# Patient Record
Sex: Male | Born: 1935 | Race: White | Hispanic: No | Marital: Married | Smoking: Never smoker
Health system: Southern US, Community
[De-identification: ages and names within clinical notes are randomized; demographics above are authoritative.]

## PROBLEM LIST (undated history)

## (undated) DIAGNOSIS — E785 Hyperlipidemia, unspecified: Secondary | ICD-10-CM

## (undated) DIAGNOSIS — I4891 Unspecified atrial fibrillation: Secondary | ICD-10-CM

## (undated) DIAGNOSIS — I639 Cerebral infarction, unspecified: Secondary | ICD-10-CM

## (undated) DIAGNOSIS — N400 Enlarged prostate without lower urinary tract symptoms: Secondary | ICD-10-CM

## (undated) DIAGNOSIS — M109 Gout, unspecified: Secondary | ICD-10-CM

## (undated) DIAGNOSIS — I10 Essential (primary) hypertension: Secondary | ICD-10-CM

## (undated) DIAGNOSIS — G4700 Insomnia, unspecified: Secondary | ICD-10-CM

## (undated) HISTORY — PX: SUPRAPUBIC CATHETER INSERTION: SUR719

---

## 2015-07-02 ENCOUNTER — Emergency Department (HOSPITAL_COMMUNITY)
Admission: EM | Admit: 2015-07-02 | Discharge: 2015-07-02 | Disposition: A | Discharge status: Home / Self Care | Payer: Medicare Other | Attending: Emergency Medicine | Admitting: Emergency Medicine

## 2015-07-02 ENCOUNTER — Emergency Department (HOSPITAL_COMMUNITY): Payer: Medicare Other

## 2015-07-02 ENCOUNTER — Encounter (HOSPITAL_COMMUNITY): Payer: Self-pay | Admitting: Emergency Medicine

## 2015-07-02 DIAGNOSIS — R140 Abdominal distension (gaseous): Secondary | ICD-10-CM | POA: Diagnosis not present

## 2015-07-02 DIAGNOSIS — R339 Retention of urine, unspecified: Secondary | ICD-10-CM | POA: Insufficient documentation

## 2015-07-02 DIAGNOSIS — I10 Essential (primary) hypertension: Secondary | ICD-10-CM | POA: Insufficient documentation

## 2015-07-02 DIAGNOSIS — M25551 Pain in right hip: Secondary | ICD-10-CM

## 2015-07-02 DIAGNOSIS — Z87438 Personal history of other diseases of male genital organs: Secondary | ICD-10-CM | POA: Insufficient documentation

## 2015-07-02 DIAGNOSIS — Z8673 Personal history of transient ischemic attack (TIA), and cerebral infarction without residual deficits: Secondary | ICD-10-CM | POA: Insufficient documentation

## 2015-07-02 DIAGNOSIS — R52 Pain, unspecified: Secondary | ICD-10-CM

## 2015-07-02 HISTORY — DX: Cerebral infarction, unspecified: I63.9

## 2015-07-02 HISTORY — DX: Benign prostatic hyperplasia without lower urinary tract symptoms: N40.0

## 2015-07-02 HISTORY — DX: Essential (primary) hypertension: I10

## 2015-07-02 LAB — BASIC METABOLIC PANEL
Anion gap: 6 (ref 5–15)
BUN: 38 mg/dL — AB (ref 6–20)
CO2: 26 mmol/L (ref 22–32)
Calcium: 9.2 mg/dL (ref 8.9–10.3)
Chloride: 110 mmol/L (ref 101–111)
Creatinine, Ser: 1.45 mg/dL — ABNORMAL HIGH (ref 0.61–1.24)
GFR calc Af Amer: 46 mL/min — ABNORMAL LOW (ref >60)
GFR calc non Af Amer: 39 mL/min — ABNORMAL LOW (ref >60)
Glucose, Bld: 103 mg/dL — ABNORMAL HIGH (ref 65–99)
POTASSIUM: 3.6 mmol/L (ref 3.5–5.1)
Sodium: 142 mmol/L (ref 135–145)

## 2015-07-02 LAB — URINALYSIS, ROUTINE W REFLEX MICROSCOPIC
Bilirubin Urine: NEGATIVE
Glucose, UA: NEGATIVE mg/dL
KETONES UR: 15 mg/dL — AB
Nitrite: NEGATIVE
Specific Gravity, Urine: 1.017 (ref 1.005–1.030)
Urobilinogen, UA: 1 mg/dL (ref 0.0–1.0)
pH: 7.5 (ref 5.0–8.0)

## 2015-07-02 LAB — CBC WITH DIFFERENTIAL/PLATELET
BASOS PCT: 1 % (ref 0–1)
Basophils Absolute: 0 10*3/uL (ref 0.0–0.1)
Eosinophils Absolute: 0.2 10*3/uL (ref 0.0–0.7)
Eosinophils Relative: 3 % (ref 0–5)
HCT: 38.8 % — ABNORMAL LOW (ref 39.0–52.0)
Hemoglobin: 12.1 g/dL — ABNORMAL LOW (ref 13.0–17.0)
Lymphocytes Relative: 10 % — ABNORMAL LOW (ref 12–46)
Lymphs Abs: 0.5 10*3/uL — ABNORMAL LOW (ref 0.7–4.0)
MCH: 24.9 pg — ABNORMAL LOW (ref 26.0–34.0)
MCHC: 31.2 g/dL (ref 30.0–36.0)
MCV: 79.8 fL (ref 78.0–100.0)
Monocytes Absolute: 0.5 10*3/uL (ref 0.1–1.0)
Monocytes Relative: 11 % (ref 3–12)
NEUTROS ABS: 3.9 10*3/uL (ref 1.7–7.7)
Neutrophils Relative %: 75 % (ref 43–77)
Platelets: 161 10*3/uL (ref 150–400)
RBC: 4.86 MIL/uL (ref 4.22–5.81)
RDW: 16.9 % — ABNORMAL HIGH (ref 11.5–15.5)
WBC: 5.1 10*3/uL (ref 4.0–10.5)

## 2015-07-02 LAB — URINE MICROSCOPIC-ADD ON

## 2015-07-02 MED ORDER — HYDROCODONE-ACETAMINOPHEN 5-325 MG PO TABS
1.0000 | ORAL_TABLET | Freq: Once | ORAL | Status: AC
  Administered 2015-07-02: 1 via ORAL
  Filled 2015-07-02: qty 1

## 2015-07-02 MED ORDER — HYDROCODONE-ACETAMINOPHEN 5-325 MG PO TABS: 1.0000 | ORAL_TABLET | Freq: Four times a day (QID) | ORAL | Status: DC | PRN

## 2015-07-02 NOTE — ED Notes (Signed)
MD notfied patient would like to see him

## 2015-07-02 NOTE — ED Provider Notes (Signed)
CSN: [id number]     Arrival date & time 07/02/15  1323 History   First MD Initiated Contact with Patient 07/02/15 1512     Chief Complaint  Patient presents with  . catheter change    . Urinary Retention  . right hip pain      (Consider location/radiation/quality/duration/timing/severity/associated sxs/prior Treatment) The history is provided by the patient and a relative.   patient brought in by family recently moved from out of town. Patient has follow-up with new primary care doctor in a few days. Patient's had an indwelling suprapubic catheter due to prostate problems for many months. Normally changed every 8 weeks. Has not been changed recently. Patient has not had any urine come out of it of 4 approximately 48 hours. Distended abdomen.          Past Medical History  Diagnosis Date  . Hypertension   . Prostate enlargement   . Stroke     no residual weakness   Past Surgical History  Procedure Laterality Date  . Suprapubic catheter insertion     No family history on file. Social History  Substance Use Topics  . Smoking status: Never Smoker   . Smokeless tobacco: None  . Alcohol Use: No    Review of Systems  Constitutional: Negative for fever.  HENT: Negative for congestion.   Respiratory: Negative for shortness of breath.   Cardiovascular: Negative for chest pain.  Gastrointestinal: Negative for abdominal pain.  Genitourinary: Negative for dysuria.  Musculoskeletal: Negative for back pain.  Neurological: Negative for headaches.  Hematological: Does not bruise/bleed easily.      Allergies  Review of patient's allergies indicates not on file.  Home Medications   Prior to Admission medications   Medication Sig Start Date End Date Taking? Authorizing Provider  HYDROcodone-acetaminophen (NORCO/VICODIN) 5-325 MG per tablet Take 1-2 tablets by mouth every 6 (six) hours as needed for moderate pain. 07/02/15   Fredia Sorrow, MD   BP 148/76 mmHg  Pulse 57   Temp(Src) 98.2 F (36.8 C) (Oral)  Resp 18  Ht 6\' 1"  (1.854 m)  Wt 170 lb (77.111 kg)  BMI 22.43 kg/m2  SpO2 95% Physical Exam  Constitutional: He is oriented to person, place, and time. He appears well-developed and well-nourished. No distress.  HENT:  Head: Normocephalic and atraumatic.  Mouth/Throat: Oropharynx is clear and moist.  Eyes: Conjunctivae and EOM are normal. Pupils are equal, round, and reactive to light.  Neck: Normal range of motion.  Cardiovascular: Normal rate, regular rhythm and normal heart sounds.   No murmur heard. Pulmonary/Chest: Effort normal and breath sounds normal.  Abdominal: Soft. Bowel sounds are normal. He exhibits distension.  Abdomen distended. Superior pubic catheter in place but not draining.  Musculoskeletal: Normal range of motion.  Neurological: He is alert and oriented to person, place, and time. No cranial nerve deficit. He exhibits normal muscle tone. Coordination normal.  Skin: Skin is warm. No rash noted.  Nursing note and vitals reviewed.   ED Course  Procedures (including critical care time) Labs Review Labs Reviewed  BASIC METABOLIC PANEL - Abnormal; Notable for the following:    Glucose, Bld 103 (*)    BUN 38 (*)    Creatinine, Ser 1.45 (*)    GFR calc non Af Amer 39 (*)    GFR calc Af Amer 46 (*)    All other components within normal limits  CBC WITH DIFFERENTIAL/PLATELET - Abnormal; Notable for the following:    Hemoglobin 12.1 (*)  HCT 38.8 (*)    MCH 24.9 (*)    RDW 16.9 (*)    Lymphocytes Relative 10 (*)    Lymphs Abs 0.5 (*)    All other components within normal limits  URINALYSIS, ROUTINE W REFLEX MICROSCOPIC (NOT AT [facility]) - Abnormal; Notable for the following:    Color, Urine RED (*)    APPearance TURBID (*)    Hgb urine dipstick LARGE (*)    Ketones, ur 15 (*)    Protein, ur >300 (*)    Leukocytes, UA LARGE (*)    All other components within normal limits  URINE MICROSCOPIC-ADD ON - Abnormal; Notable for  the following:    Bacteria, UA MANY (*)    Crystals TRIPLE PHOSPHATE CRYSTALS (*)    All other components within normal limits  URINE CULTURE    Imaging Review Dg Hip Unilat With Pelvis 2-3 Views Right  07/02/2015   CLINICAL DATA:  Right hip pain for several months without known injury. Initial encounter.  EXAM: DG HIP (WITH OR WITHOUT PELVIS) 2-3V RIGHT  COMPARISON:  None.  FINDINGS: There is no evidence of hip fracture or dislocation. There is no evidence of arthropathy or other focal bone abnormality.  IMPRESSION: Normal right hip.   Electronically Signed   By: Marijo Conception, M.D.   On: 07/02/2015 15:06   I, Joeanna Howdyshell, personally reviewed and evaluated these images and lab results as part of my medical decision-making.   EKG Interpretation None      MDM   Final diagnoses:  Urinary retention  Hip pain, right    Patient with the suprapubic catheter due to prostate obstruction been present at least for 8 months. Usually gets changed every 8 weeks. No urine is come out of the catheter for probably about 18 hours. Suprapubic catheter was replaced with a 16 French Foley good flow out. Some sediment no significant bleeding urine sent for culture but these urines normally are consistent with urinary tract infection. Patient has no systemic symptoms no fever no confusion so will not treat empirically with anti-biotics  Patient also with complaint of right hip pain worries been having trouble with bursitis patient had a cortisone injection in there in the past. No new falls or injuries. Examination of that area without any obvious abnormalities no redness. Will treat just with pain medicine for now.  Fredia Sorrow, MD 07/07/15 1747

## 2015-07-02 NOTE — ED Notes (Signed)
Pt in radiology. Radiology transport to bring pt to room.  

## 2015-07-02 NOTE — ED Notes (Signed)
MD at bedside to change cath.

## 2015-07-02 NOTE — ED Notes (Signed)
Patient currently not in room

## 2015-07-02 NOTE — Discharge Instructions (Signed)
Take pain medicine as needed for your hip. Follow-up with your doctor that you got arranged on Monday. Empty your bladder every 4-6 hours through the suprapubic catheter. Return for any new or worse symptoms.

## 2015-07-02 NOTE — ED Notes (Addendum)
Pt new to town, moved here this week from [place]-- has a supra-pubic catheter for prostate problems (no cancer). States catheter is not draining as normal, but feels like it is clogged. Son is with pt, states yesterday pt was a little confused. Not confused today. Gets catheter changed every 8 weeks --- has appt Monday with [facility] at [facility] in the independent living area.  Had a cortisone shot in right hip prior to coming here -- having severe pain in right hip at present

## 2015-07-03 LAB — URINE CULTURE

## 2015-07-25 ENCOUNTER — Emergency Department (HOSPITAL_COMMUNITY): Payer: PPO

## 2015-07-25 ENCOUNTER — Emergency Department (HOSPITAL_COMMUNITY)
Admission: EM | Admit: 2015-07-25 | Discharge: 2015-07-25 | Disposition: A | Discharge status: Home / Self Care | Payer: PPO | Attending: Emergency Medicine | Admitting: Emergency Medicine

## 2015-07-25 ENCOUNTER — Encounter (HOSPITAL_COMMUNITY): Payer: Self-pay | Admitting: *Deleted

## 2015-07-25 DIAGNOSIS — M6281 Muscle weakness (generalized): Secondary | ICD-10-CM | POA: Insufficient documentation

## 2015-07-25 DIAGNOSIS — M549 Dorsalgia, unspecified: Secondary | ICD-10-CM | POA: Diagnosis present

## 2015-07-25 DIAGNOSIS — N390 Urinary tract infection, site not specified: Secondary | ICD-10-CM | POA: Diagnosis not present

## 2015-07-25 DIAGNOSIS — Z8673 Personal history of transient ischemic attack (TIA), and cerebral infarction without residual deficits: Secondary | ICD-10-CM | POA: Insufficient documentation

## 2015-07-25 DIAGNOSIS — R531 Weakness: Secondary | ICD-10-CM

## 2015-07-25 DIAGNOSIS — I10 Essential (primary) hypertension: Secondary | ICD-10-CM | POA: Insufficient documentation

## 2015-07-25 DIAGNOSIS — Z87438 Personal history of other diseases of male genital organs: Secondary | ICD-10-CM | POA: Insufficient documentation

## 2015-07-25 LAB — URINALYSIS, ROUTINE W REFLEX MICROSCOPIC
Bilirubin Urine: NEGATIVE
GLUCOSE, UA: NEGATIVE mg/dL
Ketones, ur: NEGATIVE mg/dL
Nitrite: NEGATIVE
PH: 5.5 (ref 5.0–8.0)
Protein, ur: 30 mg/dL — AB
Specific Gravity, Urine: 1.016 (ref 1.005–1.030)
Urobilinogen, UA: 0.2 mg/dL (ref 0.0–1.0)

## 2015-07-25 LAB — COMPREHENSIVE METABOLIC PANEL
ALBUMIN: 3.3 g/dL — AB (ref 3.5–5.0)
ALT: 30 U/L (ref 17–63)
AST: 35 U/L (ref 15–41)
Alkaline Phosphatase: 60 U/L (ref 38–126)
Anion gap: 9 (ref 5–15)
BUN: 32 mg/dL — ABNORMAL HIGH (ref 6–20)
CO2: 26 mmol/L (ref 22–32)
Calcium: 9 mg/dL (ref 8.9–10.3)
Chloride: 104 mmol/L (ref 101–111)
Creatinine, Ser: 1.58 mg/dL — ABNORMAL HIGH (ref 0.61–1.24)
GFR calc Af Amer: 41 mL/min — ABNORMAL LOW (ref >60)
GFR, EST NON AFRICAN AMERICAN: 35 mL/min — AB (ref >60)
Glucose, Bld: 105 mg/dL — ABNORMAL HIGH (ref 65–99)
POTASSIUM: 3.6 mmol/L (ref 3.5–5.1)
SODIUM: 139 mmol/L (ref 135–145)
Total Bilirubin: 0.7 mg/dL (ref 0.3–1.2)
Total Protein: 6.4 g/dL — ABNORMAL LOW (ref 6.5–8.1)

## 2015-07-25 LAB — CBC WITH DIFFERENTIAL/PLATELET
Basophils Absolute: 0 10*3/uL (ref 0.0–0.1)
Basophils Relative: 0 % (ref 0–1)
Eosinophils Absolute: 0.1 10*3/uL (ref 0.0–0.7)
Eosinophils Relative: 2 % (ref 0–5)
HCT: 39.4 % (ref 39.0–52.0)
HEMOGLOBIN: 12.1 g/dL — AB (ref 13.0–17.0)
LYMPHS ABS: 0.8 10*3/uL (ref 0.7–4.0)
Lymphocytes Relative: 12 % (ref 12–46)
MCH: 24.4 pg — AB (ref 26.0–34.0)
MCHC: 30.7 g/dL (ref 30.0–36.0)
MCV: 79.6 fL (ref 78.0–100.0)
Monocytes Absolute: 0.4 10*3/uL (ref 0.1–1.0)
Monocytes Relative: 7 % (ref 3–12)
Neutro Abs: 5 10*3/uL (ref 1.7–7.7)
Neutrophils Relative %: 79 % — ABNORMAL HIGH (ref 43–77)
Platelets: 256 10*3/uL (ref 150–400)
RBC: 4.95 MIL/uL (ref 4.22–5.81)
RDW: 16.8 % — ABNORMAL HIGH (ref 11.5–15.5)
WBC: 6.4 10*3/uL (ref 4.0–10.5)

## 2015-07-25 LAB — I-STAT CG4 LACTIC ACID, ED
Lactic Acid, Venous: 1.87 mmol/L (ref 0.5–2.0)
Lactic Acid, Venous: 0.93 mmol/L (ref 0.5–2.0)

## 2015-07-25 LAB — URINE MICROSCOPIC-ADD ON

## 2015-07-25 LAB — TROPONIN I

## 2015-07-25 MED ORDER — CEPHALEXIN 500 MG PO CAPS: 500.0000 mg | ORAL_CAPSULE | Freq: Two times a day (BID) | ORAL | Status: DC

## 2015-07-25 NOTE — Discharge Instructions (Signed)

## 2015-07-25 NOTE — ED Provider Notes (Signed)
CSN: [id number]     Arrival date & time 07/25/15  1609 History   First MD Initiated Contact with Patient 07/25/15 1851     Chief Complaint  Patient presents with  . Urinary Tract Infection  . Back Pain     (Consider location/radiation/quality/duration/timing/severity/associated sxs/prior Treatment) Patient is a 81 y.o. male presenting with general illness.  Illness Quality:  Confusion, generalized weakness,low back pain, urinary urgency  Severity:  Moderate Onset quality:  Gradual Duration:  1 week Timing:  Constant Progression:  Worsening Chronicity:  New Relieved by:  Nothing Ineffective treatments:  Placed on an antibiotic 3 days ago.  pt not sure which abx.   Associated symptoms: no chest pain, no cough, no fever, no nausea and no shortness of breath     Past Medical History  Diagnosis Date  . Hypertension   . Prostate enlargement   . Stroke     no residual weakness   Past Surgical History  Procedure Laterality Date  . Suprapubic catheter insertion     No family history on file. Social History  Substance Use Topics  . Smoking status: Never Smoker   . Smokeless tobacco: None  . Alcohol Use: No    Review of Systems  Constitutional: Negative for fever.  Respiratory: Negative for cough and shortness of breath.   Cardiovascular: Negative for chest pain.  Gastrointestinal: Negative for nausea.  All other systems reviewed and are negative.     Allergies  Review of patient's allergies indicates no known allergies.  Home Medications   Prior to Admission medications   Medication Sig Start Date End Date Taking? Authorizing Provider  HYDROcodone-acetaminophen (NORCO/VICODIN) 5-325 MG per tablet Take 1-2 tablets by mouth every 6 (six) hours as needed for moderate pain. 07/02/15   Fredia Sorrow, MD   BP 178/77 mmHg  Pulse 44  Temp(Src) 97.4 F (36.3 C) (Oral)  Resp 16  Ht 6\' 1"  (1.854 m)  Wt 170 lb (77.111 kg)  BMI 22.43 kg/m2  SpO2 100% Physical Exam   Constitutional: He is oriented to person, place, and time. He appears well-developed and well-nourished. No distress.  Generalized weakness  HENT:  Head: Normocephalic and atraumatic.  Mouth/Throat: Oropharynx is clear and moist.  Eyes: Conjunctivae are normal. Pupils are equal, round, and reactive to light. No scleral icterus.  Neck: Neck supple.  Cardiovascular: Normal rate, regular rhythm, normal heart sounds and intact distal pulses.   No murmur heard. Pulmonary/Chest: Effort normal and breath sounds normal. No stridor. No respiratory distress. He has no wheezes. He has no rales.  Abdominal: Soft. He exhibits no distension.  Soft. Mild tenderness in suprapubic region.  SPC in place without erythema or drainage.   Musculoskeletal: Normal range of motion. He exhibits no edema.  Neurological: He is alert and oriented to person, place, and time.  Skin: Skin is warm and dry. No rash noted.  Psychiatric: He has a normal mood and affect. His behavior is normal.  Nursing note and vitals reviewed.   ED Course  Procedures (including critical care time) Labs Review Labs Reviewed  COMPREHENSIVE METABOLIC PANEL - Abnormal; Notable for the following:    Glucose, Bld 105 (*)    BUN 32 (*)    Creatinine, Ser 1.58 (*)    Total Protein 6.4 (*)    Albumin 3.3 (*)    GFR calc non Af Amer 35 (*)    GFR calc Af Amer 41 (*)    All other components within normal limits  CBC  WITH DIFFERENTIAL/PLATELET - Abnormal; Notable for the following:    Hemoglobin 12.1 (*)    MCH 24.4 (*)    RDW 16.8 (*)    Neutrophils Relative % 79 (*)    All other components within normal limits  URINALYSIS, ROUTINE W REFLEX MICROSCOPIC (NOT AT [facility]) - Abnormal; Notable for the following:    APPearance CLOUDY (*)    Hgb urine dipstick MODERATE (*)    Protein, ur 30 (*)    Leukocytes, UA MODERATE (*)    All other components within normal limits  URINE MICROSCOPIC-ADD ON - Abnormal; Notable for the following:     Bacteria, UA FEW (*)    All other components within normal limits  URINE CULTURE  TROPONIN I  I-STAT CG4 LACTIC ACID, ED  I-STAT CG4 LACTIC ACID, ED    Imaging Review Dg Chest 2 View  07/25/2015   CLINICAL DATA:  Urinary frequency. Suprapubic catheter. Low back pain. Increased weakness and difficulty ambulating.  EXAM: CHEST  2 VIEW  COMPARISON:  None.  FINDINGS: Mild-to-moderate enlargement of the cardiopericardial silhouette noted with aortic tortuosity and atherosclerotic calcification of the aortic arch.  Thoracic spondylosis. The lungs appear clear aside from some mild scarring or atelectasis along the major fissure.  IMPRESSION: 1. No acute thoracic findings. 2. Mild to moderate enlargement of the cardiopericardial silhouette, without edema. Atherosclerotic aortic arch. 3. Thoracic spondylosis.   Electronically Signed   By: Van Clines M.D.   On: 07/25/2015 20:41   I have personally reviewed and evaluated these images and lab results as part of my medical decision-making.   EKG Interpretation   Date/Time:  Sunday July 25 2015 20:01:29 EDT Ventricular Rate:  46 PR Interval:  226 QRS Duration: 100 QT Interval:  529 QTC Calculation: 463 R Axis:   73 Text Interpretation:  Sinus bradycardia Borderline prolonged PR interval  LVH with secondary repolarization abnormality No old tracing to compare  Confirmed by [facility]  MD, TREY ([id number]) on 07/25/2015 11:05:19 PM      MDM   Final diagnoses:  Generalized weakness  UTI (lower urinary tract infection)    81 yo male with a week of generalize weakness.  It is not improved despite a UTI being treated with Cipro for the past few days. I spoke with his primary care doctor, Dr. Darron Doom, who was concerned about a potential metabolic problem, such as hyponatremia. Fortunately, metabolically, he appears stable.   On exam, he initially appeared weak, but nontoxic. But on recheck, he no longer appeared weak, he had eaten an entire meal,  and he ambulated with baseline strength and coordination. He and his family do not want to wait in the ED for head imaging. Given his appearance at time of discharge, it is reasonable to defer this test. He does not have focal symptoms suggestive of CVA.  His UA does still show signs of infection. He had a suprapubic catheter exchange 2 days ago. Will change Cipro to Keflex. He can follow-up with his primary doctor in a few days. He also has ample support at his living facility.   He was given return precautions.    Serita Grit, MD 07/25/15 [phone]

## 2015-07-25 NOTE — ED Notes (Addendum)
Pt was sent here by Dr Darron Doom ([facility]) for urinary frequency.   His son sates he has been on oral antibiotics for a UTI, however, he continues to wake up every 15 minutes with he urge to urinate (he does have a suprapubic catheter).  He's also c/o lower back pain and bil thigh pain.  Pt also c/o increased weakness and difficulty ambulating.

## 2015-07-26 LAB — URINE CULTURE: Culture: NO GROWTH

## 2015-07-28 ENCOUNTER — Encounter (HOSPITAL_COMMUNITY): Payer: Self-pay | Admitting: *Deleted

## 2015-07-28 ENCOUNTER — Inpatient Hospital Stay (HOSPITAL_COMMUNITY)
Admission: EM | Admit: 2015-07-28 | Discharge: 2015-07-31 | DRG: 065 | Disposition: A | Discharge status: Skilled Nursing Facility | Payer: PPO | Attending: Internal Medicine | Admitting: Internal Medicine

## 2015-07-28 ENCOUNTER — Emergency Department (HOSPITAL_COMMUNITY): Payer: PPO

## 2015-07-28 DIAGNOSIS — Z79899 Other long term (current) drug therapy: Secondary | ICD-10-CM | POA: Diagnosis not present

## 2015-07-28 DIAGNOSIS — N3000 Acute cystitis without hematuria: Secondary | ICD-10-CM | POA: Diagnosis not present

## 2015-07-28 DIAGNOSIS — I480 Paroxysmal atrial fibrillation: Secondary | ICD-10-CM | POA: Diagnosis present

## 2015-07-28 DIAGNOSIS — I4891 Unspecified atrial fibrillation: Secondary | ICD-10-CM | POA: Diagnosis present

## 2015-07-28 DIAGNOSIS — R4701 Aphasia: Secondary | ICD-10-CM | POA: Diagnosis not present

## 2015-07-28 DIAGNOSIS — Z7982 Long term (current) use of aspirin: Secondary | ICD-10-CM | POA: Diagnosis not present

## 2015-07-28 DIAGNOSIS — I6789 Other cerebrovascular disease: Secondary | ICD-10-CM | POA: Diagnosis not present

## 2015-07-28 DIAGNOSIS — Z936 Other artificial openings of urinary tract status: Secondary | ICD-10-CM

## 2015-07-28 DIAGNOSIS — I639 Cerebral infarction, unspecified: Secondary | ICD-10-CM | POA: Diagnosis present

## 2015-07-28 DIAGNOSIS — I6340 Cerebral infarction due to embolism of unspecified cerebral artery: Secondary | ICD-10-CM | POA: Diagnosis present

## 2015-07-28 DIAGNOSIS — C61 Malignant neoplasm of prostate: Secondary | ICD-10-CM | POA: Diagnosis not present

## 2015-07-28 DIAGNOSIS — I10 Essential (primary) hypertension: Secondary | ICD-10-CM | POA: Diagnosis present

## 2015-07-28 DIAGNOSIS — I6931 Cognitive deficits following cerebral infarction: Secondary | ICD-10-CM | POA: Diagnosis not present

## 2015-07-28 DIAGNOSIS — N390 Urinary tract infection, site not specified: Secondary | ICD-10-CM | POA: Diagnosis present

## 2015-07-28 DIAGNOSIS — Z66 Do not resuscitate: Secondary | ICD-10-CM | POA: Diagnosis not present

## 2015-07-28 DIAGNOSIS — F05 Delirium due to known physiological condition: Secondary | ICD-10-CM | POA: Diagnosis not present

## 2015-07-28 DIAGNOSIS — N400 Enlarged prostate without lower urinary tract symptoms: Secondary | ICD-10-CM | POA: Diagnosis not present

## 2015-07-28 DIAGNOSIS — E785 Hyperlipidemia, unspecified: Secondary | ICD-10-CM | POA: Diagnosis present

## 2015-07-28 LAB — I-STAT CHEM 8, ED
BUN: 30 mg/dL — AB (ref 6–20)
CALCIUM ION: 1.12 mmol/L — AB (ref 1.13–1.30)
CHLORIDE: 105 mmol/L (ref 101–111)
CREATININE: 1.6 mg/dL — AB (ref 0.61–1.24)
Glucose, Bld: 93 mg/dL (ref 65–99)
HCT: 37 % — ABNORMAL LOW (ref 39.0–52.0)
Hemoglobin: 12.6 g/dL — ABNORMAL LOW (ref 13.0–17.0)
Potassium: 3.7 mmol/L (ref 3.5–5.1)
SODIUM: 140 mmol/L (ref 135–145)
TCO2: 21 mmol/L (ref 0–100)

## 2015-07-28 LAB — COMPREHENSIVE METABOLIC PANEL
ALBUMIN: 3.1 g/dL — AB (ref 3.5–5.0)
ALK PHOS: 59 U/L (ref 38–126)
ALT: 26 U/L (ref 17–63)
AST: 26 U/L (ref 15–41)
Anion gap: 8 (ref 5–15)
BUN: 29 mg/dL — AB (ref 6–20)
CALCIUM: 8.6 mg/dL — AB (ref 8.9–10.3)
CO2: 24 mmol/L (ref 22–32)
CREATININE: 1.67 mg/dL — AB (ref 0.61–1.24)
Chloride: 105 mmol/L (ref 101–111)
GFR calc Af Amer: 38 mL/min — ABNORMAL LOW (ref >60)
GFR calc non Af Amer: 33 mL/min — ABNORMAL LOW (ref >60)
GLUCOSE: 97 mg/dL (ref 65–99)
Potassium: 3.7 mmol/L (ref 3.5–5.1)
SODIUM: 137 mmol/L (ref 135–145)
Total Bilirubin: 0.7 mg/dL (ref 0.3–1.2)
Total Protein: 5.7 g/dL — ABNORMAL LOW (ref 6.5–8.1)

## 2015-07-28 LAB — CBC
HCT: 37.9 % — ABNORMAL LOW (ref 39.0–52.0)
HEMOGLOBIN: 11.8 g/dL — AB (ref 13.0–17.0)
MCH: 24.7 pg — ABNORMAL LOW (ref 26.0–34.0)
MCHC: 31.1 g/dL (ref 30.0–36.0)
MCV: 79.3 fL (ref 78.0–100.0)
PLATELETS: 187 10*3/uL (ref 150–400)
RBC: 4.78 MIL/uL (ref 4.22–5.81)
RDW: 17.1 % — ABNORMAL HIGH (ref 11.5–15.5)
WBC: 5.6 10*3/uL (ref 4.0–10.5)

## 2015-07-28 LAB — DIFFERENTIAL
Basophils Absolute: 0 10*3/uL (ref 0.0–0.1)
Basophils Relative: 1 % (ref 0–1)
Eosinophils Absolute: 0.1 10*3/uL (ref 0.0–0.7)
Eosinophils Relative: 2 % (ref 0–5)
LYMPHS ABS: 0.7 10*3/uL (ref 0.7–4.0)
LYMPHS PCT: 13 % (ref 12–46)
Monocytes Absolute: 0.4 10*3/uL (ref 0.1–1.0)
Monocytes Relative: 7 % (ref 3–12)
NEUTROS ABS: 4.4 10*3/uL (ref 1.7–7.7)
NEUTROS PCT: 77 % (ref 43–77)

## 2015-07-28 LAB — I-STAT TROPONIN, ED: Troponin i, poc: 0.02 ng/mL (ref 0.00–0.08)

## 2015-07-28 LAB — ETHANOL: Alcohol, Ethyl (B): <5 mg/dL (ref <5)

## 2015-07-28 LAB — PROTIME-INR
INR: 1.07 (ref 0.00–1.49)
PROTHROMBIN TIME: 14.1 s (ref 11.6–15.2)

## 2015-07-28 LAB — APTT: aPTT: 22 seconds — ABNORMAL LOW (ref 24–37)

## 2015-07-28 MED ORDER — ASPIRIN 300 MG RE SUPP: 300.0000 mg | Freq: Every day | RECTAL | Status: DC

## 2015-07-28 MED ORDER — ASPIRIN 325 MG PO TABS: 325.0000 mg | ORAL_TABLET | Freq: Every day | ORAL | Status: DC

## 2015-07-28 NOTE — H&P (Signed)
Date: 07/28/2015               Charles [name] Name:  Charles Charles [name] Charles [name] MRN: [id number]  DOB: Apr 06, 1918 Age / Sex: 81 y.o., male   PCP: Charles Rasmussen, MD         Medical Charles [name]: Charles Charles [name] Charles [name]         Attending Physician: Dr. Quintella Reichert, MD    First Contact: Dr. Juleen [name] Pager: [phone]  Second Contact: Dr. Genene [name] Pager: [phone]       After Hours (After 5p/  First Contact Pager: [phone]  weekends / holidays): Second Contact Pager: [phone]   Chief Complaint: Stroke  History of Present Illness: Charles Charles [name] Charles [name] is a 81 yo male with HTN and arrhythmia on Amiodarone, presenting with 4 hour h/o right sided weakness and slurred speech.  History was obtained from Charles Charles [name] Charles [name] son.  Per son, Charles Charles [name] Charles [name] was found down at home by a home health nurse.  It is unclear what time Charles Charles [name] symptoms started.  Per son, there Charles Charles [name] Charles [name] was conscious, but confused, with slurred speech and right sided weakness.  There was no report of LOC or loss of bowel/bladder control.  In [name] ED, his weakness had resolved, though Charles Charles [name] Charles [name] continued to have slurred, though intelligible speech.  Per son, Charles Charles [name] Charles [name] has intermittently had trouble opening his eyes, although they have spontaneously opened while in [name] ED.  Charles Charles [name] Charles [name] has had one stroke in Charles Charles [name] past, about 6 years ago.  At that time he difficulty with speech, though symptoms had resolved over Charles Charles [name] next 6 months.  Of note, this HPI differs from Charles Charles [name] story provided to [facility] Charles [name] Neurology physician  Per Charles [name] son, Charles [name] has not been complaining of fever, chills, CP, SOB, N/V, C/D, or difficulty urinating.  Charles Charles [name] Charles [name] has a suprapubic catheter 2/2 BPH.  He was seen at [name] in August 2016 for weakness, confusion, and UTI.  UTI symptoms had not resolved by 9/4, and Charles [name] antibiotic were switched to Keflex (9/4-9/11).  Charles Charles [name] son does not know Charles Charles [name] nature of his cardiac arrhythmia, but reports that it occurred during a hospitalization.  Charles Charles [name] Charles [name] was  discharged on amiodarone and a beta blocker.    Prior to this episode, Charles [name] was able to perform all ADLs and IADLs.  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  In [name] ED, his initial NIHSS was 8. tPA was not administered, as it was unclear how long Charles Charles [name] Charles [name] had been having symptoms.    Charles [name] advanced directive states that he is DNR/DNI, but would like medical therapies.  Meds: Current Facility-Administered Medications  Medication Dose Route Frequency Provider Last Rate Last Dose  . [START ON 07/29/2015] aspirin tablet 325 mg  325 mg Oral Daily Charles Sherral Hammers, MD       Or  . Derrill Memo ON 07/29/2015] aspirin suppository 300 mg  300 mg Rectal Daily Charles Sherral Hammers, MD       Current Outpatient Prescriptions  Medication Sig Dispense Refill  . allopurinol (ZYLOPRIM) 100 MG tablet Take 100 mg by mouth daily.    Charles Charles [name] Charles [name] Kitchen amiodarone (PACERONE) 200 MG tablet Take 100 mg by mouth daily.    Charles Charles [name] Charles [name] Kitchen aspirin EC 81 MG tablet Take 81 mg by mouth daily.    . bisoprolol-hydrochlorothiazide (ZIAC) 10-6.25 MG per tablet Take 1 tablet by mouth daily.    . cephALEXin (KEFLEX) 500 MG capsule Take 1 capsule (500 mg total) by mouth 2 (two) times daily. Charles [name]  capsule 0  . gabapentin (NEURONTIN) 100 MG capsule Take 200 mg by mouth at bedtime.    . Omega-3 Fatty Acids (FISH OIL PO) Take 1 tablet by mouth every evening.    . pravastatin (PRAVACHOL) 20 MG tablet Take 20 mg by mouth daily.    Charles Charles [name] Charles [name] Kitchen HYDROcodone-acetaminophen (NORCO/VICODIN) 5-325 MG per tablet Take 1-2 tablets by mouth every 6 (six) hours as needed for moderate pain. (Charles [name] not taking: Reported on 07/28/2015) 20 tablet 0    Allergies: Allergies as of 07/28/2015  . (No Known Allergies)   Past Medical History  Diagnosis Date  . Hypertension   . Prostate enlargement   . Stroke     no residual weakness   Past Surgical History  Procedure Laterality Date  . Suprapubic catheter insertion     History reviewed. No pertinent family  history. Social History   Social History  . Marital Status: Married    Spouse Name: N/A  . Number of Children: N/A  . Years of Education: N/A   Occupational History  . Not on file.   Social History Main Topics  . Smoking status: Never Smoker   . Smokeless tobacco: Not on file  . Alcohol Use: No  . Drug Use: No  . Sexual Activity: Not on file   Other Topics Concern  . Not on file   Social History Narrative    Review of Systems: Pertinent items are noted in HPI.  Physical Exam: Blood pressure 179/77, pulse 47, temperature 98.7 F (37.1 C), temperature source Oral, resp. rate 16, SpO2 95 %. Physical Exam  Constitutional: He is well-developed, well-nourished, and in no distress.  Lying in bed trying to sleep.  HENT:  Head: Normocephalic and atraumatic.  Eyes:  Unable to open eyes on command.  Pupils constricted. Nonresponsive to light  Neck: Normal range of motion. No tracheal deviation present.  Cardiovascular: Regular rhythm, normal heart sounds and intact distal pulses.   Bradycardic.  Pulmonary/Chest: Effort normal and breath sounds normal. No respiratory distress. He has no wheezes.  Abdominal: Soft. He exhibits no distension. There is no tenderness. There is no rebound and no guarding.  Musculoskeletal: He exhibits no edema.  Neurological: He is alert.  Strength 5/5 in upper and lower extremities. Biceps and Patellar reflexes 2+ and symmetric.  Babinski positive bilaterally.  CN II-IV,VI: Unable to be assessed, as Charles [name] could not open eyes.  Pupils nonreactive to light. CN V, IX-XII: Intact to bedside testing  Skin: Skin is warm and dry.     Lab results: Basic Metabolic Panel:  Recent Labs  07/28/15 1858 07/28/15 1936  NA 137 140  K 3.7 3.7  CL 105 105  CO2 24  --   GLUCOSE 97 93  BUN 29* 30*  CREATININE 1.67* 1.60*  CALCIUM 8.6*  --    Liver Function Tests:  Recent Labs  07/28/15 1858  AST 26  ALT 26  ALKPHOS 59  BILITOT 0.7  PROT  5.7*  ALBUMIN 3.1*   No results for input(s): LIPASE, AMYLASE in [name] last 72 hours. No results for input(s): AMMONIA in Charles Charles [name] last 72 hours. CBC:  Recent Labs  07/28/15 1858 07/28/15 1936  WBC 5.6  --   NEUTROABS 4.4  --   HGB 11.8* 12.6*  HCT 37.9* 37.0*  MCV 79.3  --   PLT 187  --    Cardiac Enzymes: No results for input(s): CKTOTAL, CKMB, CKMBINDEX, TROPONINI in [name] last 72 hours. BNP: No results for input(s): PROBNP  in Charles Charles [name] last 72 hours. D-Dimer: No results for input(s): DDIMER in Charles Charles [name] last 72 hours. CBG: No results for input(s): GLUCAP in Charles Charles [name] last 72 hours. Hemoglobin A1C: No results for input(s): HGBA1C in Charles Charles [name] last 72 hours. Fasting Lipid Panel: No results for input(s): CHOL, HDL, LDLCALC, TRIG, CHOLHDL, LDLDIRECT in Charles Charles [name] last 72 hours. Thyroid Function Tests: No results for input(s): TSH, T4TOTAL, FREET4, T3FREE, THYROIDAB in Charles Charles [name] last 72 hours. Anemia Panel: No results for input(s): VITAMINB12, FOLATE, FERRITIN, TIBC, IRON, RETICCTPCT in Charles Charles [name] last 72 hours. Coagulation:  Recent Labs  07/28/15 1858  LABPROT 14.1  INR 1.07   Urine Drug Screen: Drugs of Abuse  No results found for: LABOPIA, COCAINSCRNUR, LABBENZ, AMPHETMU, THCU, LABBARB  Alcohol Level:  Recent Labs  07/28/15 [place] <5   Urinalysis: No results for input(s): COLORURINE, LABSPEC, PHURINE, GLUCOSEU, HGBUR, BILIRUBINUR, KETONESUR, PROTEINUR, UROBILINOGEN, NITRITE, LEUKOCYTESUR in Charles [name] last 72 hours.  Invalid input(s): APPERANCEUR  Imaging results:  Ct Head Wo Contrast  07/28/2015   CLINICAL DATA:  Code stroke. Small down after waking up from that. Right-sided deficits.  EXAM: CT HEAD WITHOUT CONTRAST  TECHNIQUE: Contiguous axial images were obtained from Charles [name] base of Charles Charles [name] skull through [name] vertex without intravenous contrast.  COMPARISON:  None.  FINDINGS: Ventricles and sulci are prominent compatible with atrophy. Focal hypodensity within Charles [name] left thalamus, most compatible with remote infarct.  Left frontal parietal lobe low-attenuation (image 22; series 21). No evidence for acute intracranial hemorrhage or significant mass effect. Paranasal sinuses are well aerated. Mastoid air cells unremarkable. Calvarium is intact. Prominent arachnoid granulations within Charles [name] occipital bone.  IMPRESSION: Regional low attenuation within Charles Charles [name] left frontal parietal lobe may represent age-indeterminate, potentially subacute or infarct. Chronic left alignment lacunar infarct.  Critical Value/emergent results were called by telephone at Charles Charles [name] time of interpretation on 07/28/2015 at 7:11 pm to Dr. Doy Mince, who verbally acknowledged these results.   Electronically Signed   By: Lovey Newcomer M.D.   On: 07/28/2015 19:19    Other results: EKG: sinus bradycardia, 1st degree AV block.  Assessment & Plan by Problem: Active Problems:   * No active hospital problems. *  Mr. Russomanno is a 81 yo male with HTN and arrhythmia on Amiodarone, presenting with stroke.   Stroke: Charles [name] presented with slurred speech and right sided numbness.  Likely stroke to left frontal parietal lobe demonstrated on CT. Charles [name] symptoms mostly resolved in ED, though son endorses slurred speech.  tPA deferred as Charles [name] outside of window and does not want aggressive therapies.  Charles [name] arrhythmia is likely afib given amiodarone and BB, but is only on ASA 81 mg at home.  However, Charles [name] is in sinus rhythm on ECG.  Will perform stroke workup and medical optimization.  Charles [name] currently on Pravastatin 20 mg and ASA. - A1c, Lipids - Echo - Caroid Dopplers - MRI/MRA - [place] - PT, OT, Speech - Bedside swallow - ASA  Afib: CHADs-VASc 5 (7.2%).  Currently rhythm controlled on amiodarone.  As Charles [name] is in sinus rhythm but bradycardic, will hold beta blocker. - Amiodarone 100 mg daily - HOLD Bisoprolol-HCTZ  HTN: Currently 179/77.  Will hold BP meds to allow permissive HTN (goal BP < 220/110). - HOLD Bisoprolol-HCTZ  UTI: Charles [name] should  complete Keflex 500 mg BID on 9/11.  Will resume PO abx once Charles [name] has passed swallow screen. - UA  FEN/GI - NPO, pending swallow eval  DVT Ppx: Lovenox  Dispo: Disposition is deferred at this time, awaiting improvement of  current medical problems.   Charles Charles [name] Charles [name] does have a current PCP Charles Rasmussen, MD) and does need an [facility] hospital follow-up appointment after discharge.  Charles Charles [name] Charles [name] does not have transportation limitations that hinder transportation to clinic appointments.  Signed: Iline Oven, MD 07/28/2015, 11:12 PM

## 2015-07-28 NOTE — Consult Note (Signed)
Referring Physician: Ralene Bathe    Chief Complaint: Right sided weakness, inability to open eyes  HPI: Charles [name] is an 81 y.o. male who has recently moved to [place].  Per report of his PCP has had a precipitous decline over the past three weeks.  There has been a UTI and some confusion.  While in the ED over the weekend was advised to get a head CT but refused.  Per son with change in antibiotics for his UTI this seemed to help his cognition but he still had questions about his medications this morning which is unusual.  He laid down for a nap and when he awakened he was dizzy and went down to the floor from his chair.  It is unclear if he was totally normal after that but was able to get to supper.  He did not eat but was noted by staff to not be at baseline.  EMS was called.  En route they noted development of inability to open his eyes, right facial, right sided weakness and aphasia.  Code stroke was called.  Initial NIHSS of 8.    Date last known well: Date: 07/28/2015 Time last known well: Time: 12:30 tPA Given: No: Outside time window  mRankin:1  Past Medical History  Diagnosis Date  . Hypertension   . Prostate enlargement   . Stroke     no residual weakness    Past Surgical History  Procedure Laterality Date  . Suprapubic catheter insertion      Family history: Both parents deceased.  Unclear what led to mother's death.  Father died of a stroke at 27 yo.  Has had a daughter pass recently from gallbladder cancer.  Son alive and well with diverticulitis.  Social History:  reports that he has never smoked. He does not have any smokeless tobacco history on file. He reports that he does not drink alcohol or use illicit drugs.  Allergies: No Known Allergies  Medications: I have reviewed the patient's current medications. Prior to Admission:  Prior to Admission medications   Medication Sig Start Date End Date Taking? Authorizing Provider  allopurinol (ZYLOPRIM) 100 MG tablet Take  100 mg by mouth daily.    Historical Provider, MD  amiodarone (PACERONE) 200 MG tablet Take 100 mg by mouth daily.    Historical Provider, MD  aspirin EC 81 MG tablet Take 81 mg by mouth daily.    Historical Provider, MD  bisoprolol-hydrochlorothiazide [facility]) 10-6.25 MG per tablet Take 1 tablet by mouth daily.    Historical Provider, MD  cephALEXin (KEFLEX) 500 MG capsule Take 1 capsule (500 mg total) by mouth 2 (two) times daily. 07/25/15   Serita Grit, MD  gabapentin (NEURONTIN) 100 MG capsule Take 200 mg by mouth at bedtime.    Historical Provider, MD  HYDROcodone-acetaminophen (NORCO/VICODIN) 5-325 MG per tablet Take 1-2 tablets by mouth every 6 (six) hours as needed for moderate pain. 07/02/15   Fredia Sorrow, MD  Omega-3 Fatty Acids (FISH OIL PO) Take 1 tablet by mouth every evening.    Historical Provider, MD  pravastatin (PRAVACHOL) 20 MG tablet Take 20 mg by mouth daily.    Historical Provider, MD    ROS: History obtained from the patient and and son  General ROS: negative for - chills, fatigue, fever, night sweats, weight gain or weight loss Psychological ROS: as noted in HPI Ophthalmic ROS: difficulty with vision ENT ROS: negative for - epistaxis, nasal discharge, oral lesions, sore throat, tinnitus  Allergy and Immunology  ROS: negative for - hives or itchy/watery eyes Hematological and Lymphatic ROS: negative for - bleeding problems, bruising or swollen lymph nodes Endocrine ROS: negative for - galactorrhea, hair pattern changes, polydipsia/polyuria or temperature intolerance Respiratory ROS: negative for - cough, hemoptysis, shortness of breath or wheezing Cardiovascular ROS: negative for - chest pain, dyspnea on exertion, edema or irregular heartbeat Gastrointestinal ROS: negative for - abdominal pain, diarrhea, hematemesis, nausea/vomiting or stool incontinence Genito-Urinary ROS: suprapubic catheter, recent UTI Musculoskeletal ROS: back pain, hip pain Neurological ROS: as  noted in HPI Dermatological ROS: negative for rash and skin lesion changes  Physical Examination: Blood pressure 179/77, pulse 47, temperature 98.7 F (37.1 C), temperature source Oral, resp. rate 16, SpO2 95 %.  HEENT-  Normocephalic, no lesions, without obvious abnormality.  Normal external eye and conjunctiva.  Normal TM's bilaterally.  Normal auditory canals and external ears. Normal external nose, mucus membranes and septum.  Normal pharynx. Cardiovascular- S1, S2 normal, pulses palpable throughout   Lungs- chest clear, no wheezing, rales, normal symmetric air entry Abdomen- soft, non-tender; bowel sounds normal; no masses,  no organomegaly Extremities- no edema Lymph-no adenopathy palpable Musculoskeletal-no joint tenderness, deformity or swelling Skin-warm and dry, no hyperpigmentation, vitiligo, or suspicious lesions  Neurological Examination Mental Status: Alert, oriented to name and age.  Unable to open eyes.  Expressive aphasia noted.  Speech understood initially but the more the patent speaks, the more dysarthric he becomes.    Able to follow 3 step commands with some reinforcement. Cranial Nerves: II: Discs flat bilaterally; Patient at times reports that he can not see even when eyes are opened actively.  Despite this is able to follow my fingers and tell me the color of my gloves.  Pupils equal, round, reactive to light and accommodation III,IV, VI: Both eyes remain closed throughout the examination.  Left eye unable to move beyond midline to the right V,VII: smile symmetric, facial light touch sensation normal bilaterally VIII: hearing normal bilaterally IX,X: gag reflex present XI: bilateral shoulder shrug XII: midline tongue extension Motor: Right : Upper extremity   5-/5     Left:     Upper extremity   5/5  Lower extremity   4+/5     Lower extremity   5/5 Tone and bulk:normal tone throughout; no atrophy noted Sensory: Pinprick and light touch intact throughout,  bilaterally Deep Tendon Reflexes: 2+ and symmetric in the upper extremities and absent in the lower extremities Plantars: Right: upgoing   Left: upgoing Cerebellar: Dysmetria with finger-to-nose on the right, intact on the left.  Normal heel-to-shin testing bilaterally Gait: not tested due to safety concerns   Laboratory Studies:  Basic Metabolic Panel:  Recent Labs Lab 07/25/15 1705 07/28/15 1936  NA 139 140  K 3.6 3.7  CL 104 105  CO2 26  --   GLUCOSE 105* 93  BUN 32* 30*  CREATININE 1.58* 1.60*  CALCIUM 9.0  --     Liver Function Tests:  Recent Labs Lab 07/25/15 1705  AST 35  ALT 30  ALKPHOS 60  BILITOT 0.7  PROT 6.4*  ALBUMIN 3.3*   No results for input(s): LIPASE, AMYLASE in the last 168 hours. No results for input(s): AMMONIA in the last 168 hours.  CBC:  Recent Labs Lab 07/25/15 1705 07/28/15 1858 07/28/15 1936  WBC 6.4 5.6  --   NEUTROABS 5.0 4.4  --   HGB 12.1* 11.8* 12.6*  HCT 39.4 37.9* 37.0*  MCV 79.6 79.3  --   PLT  256 187  --     Cardiac Enzymes:  Recent Labs Lab 07/25/15 2056  TROPONINI <0.03    BNP: Invalid input(s): POCBNP  CBG: No results for input(s): GLUCAP in the last 168 hours.  Microbiology: Results for orders placed or performed during the hospital encounter of 07/25/15  Urine culture     Status: None   Collection Time: 07/25/15  5:26 PM  Result Value Ref Range Status   Specimen Description URINE, RANDOM  Final   Special Requests NONE  Final   Culture NO GROWTH 1 DAY  Final   Report Status 07/26/2015 FINAL  Final    Coagulation Studies:  Recent Labs  07/28/15 1858  LABPROT 14.1  INR 1.07    Urinalysis:  Recent Labs Lab 07/25/15 1658  COLORURINE YELLOW  LABSPEC 1.016  PHURINE 5.5  GLUCOSEU NEGATIVE  HGBUR MODERATE*  BILIRUBINUR NEGATIVE  KETONESUR NEGATIVE  PROTEINUR 30*  UROBILINOGEN 0.2  NITRITE NEGATIVE  LEUKOCYTESUR MODERATE*    Lipid Panel: No results found for: CHOL, TRIG, HDL,  CHOLHDL, VLDL, LDLCALC  HgbA1C: No results found for: HGBA1C  Urine Drug Screen:  No results found for: LABOPIA, COCAINSCRNUR, LABBENZ, AMPHETMU, THCU, LABBARB  Alcohol Level: No results for input(s): ETH in the last 168 hours.  Other results: EKG: sinus bradycardia at 48 bpm.  Imaging: Ct Head Wo Contrast  07/28/2015   CLINICAL DATA:  Code stroke. Small down after waking up from that. Right-sided deficits.  EXAM: CT HEAD WITHOUT CONTRAST  TECHNIQUE: Contiguous axial images were obtained from the base of the skull through the vertex without intravenous contrast.  COMPARISON:  None.  FINDINGS: Ventricles and sulci are prominent compatible with atrophy. Focal hypodensity within the left thalamus, most compatible with remote infarct. Left frontal parietal lobe low-attenuation (image 22; series 21). No evidence for acute intracranial hemorrhage or significant mass effect. Paranasal sinuses are well aerated. Mastoid air cells unremarkable. Calvarium is intact. Prominent arachnoid granulations within the occipital bone.  IMPRESSION: Regional low attenuation within the left frontal parietal lobe may represent age-indeterminate, potentially subacute or infarct. Chronic left alignment lacunar infarct.  Critical Value/emergent results were called by telephone at the time of interpretation on 07/28/2015 at 7:11 pm to Dr. Doy Mince, who verbally acknowledged these results.   Electronically Signed   By: Lovey Newcomer M.D.   On: 07/28/2015 19:19    Assessment: 81 y.o. male with a history of atrial fibrillation on ASA who presents with aphasia, right sided weakness, EOM abnormalities and lid apraxia.  Patient has a history of a small stroke in the past.  Was not placed on anticoagulation because he was a fall risk.  Has had a recent decline.  Unclear if some of his presentation may have represented earlier embolic events.  Did not have imaging with recent visit.  Current presentation suggests more than one vascular  distribution.  Head CT personally reviewed and shows a possible left frontal subacute infarct.  No evidence of hemorrhage noted.   Patient outside window for tPA.  After discussion with POA and PCP it was decided that the patient would not want aggressive intervention and thrombectomy was declined.    Stroke Risk Factors - atrial fibrillation and hypertension  Plan: 1. HgbA1c, fasting lipid panel 2. MRI, MRA  of the brain without contrast 3. PT consult, OT consult, Speech consult 4. Echocardiogram 5. Carotid dopplers 6. Prophylactic therapy-Antiplatelet med: Aspirin - dose 325mg  daily.  Patient a fall risk due to pre-existing ataxia and not a  candidate for anticoagulation 7. NPO until RN stroke swallow screen 8. Telemetry monitoring 9. Frequent neuro checks  Case discussed with Dr. Lerry Liner, MD [facility] [phone] 07/28/2015, 7:55 PM

## 2015-07-28 NOTE — ED Notes (Signed)
Pt arrives from [facility] via [place]. Pt lives independently and is self care. Pt states he was dizzy upon awakening and had a fall from his chair onto the floor. Pt was WDL neurologically upon EMS arrival and en route pt began having right sided weakness with right sided facial droop, aphasic, unable to follow commands and unable to open eyes. Pt can see once eyes are opened manually. Pt left eye won't pass the midline.

## 2015-07-28 NOTE — ED Provider Notes (Signed)
CSN: [id number]     Arrival date & time 07/28/15  1853 History   First MD Initiated Contact with Patient 07/28/15 1859     Chief Complaint  Patient presents with  . Code Stroke    An emergency department physician performed an initial assessment on this suspected stroke patient at 39.  The history is provided by the patient, the EMS personnel and a caregiver. No language interpreter was used.   Charles [name] presents for evaluation as a code stroke. Level V caveat due to confusion. He lives at assisted living and is independent of his ADLs. He took a nap and woke up around 3:30 this afternoon and felt dizzy and off balance. He had a fall and EMS was called. When EMS was transporting the patient to the emergency department he developed right-sided weakness and difficulty with speech. He states he cannot open his eyes. Symptoms are severe and constant nature. Past Medical History  Diagnosis Date  . Hypertension   . Prostate enlargement   . Stroke     no residual weakness   Past Surgical History  Procedure Laterality Date  . Suprapubic catheter insertion     History reviewed. No pertinent family history. Social History  Substance Use Topics  . Smoking status: Never Smoker   . Smokeless tobacco: None  . Alcohol Use: No    Review of Systems  All other systems reviewed and are negative.     Allergies  Review of patient's allergies indicates no known allergies.  Home Medications   Prior to Admission medications   Medication Sig Start Date End Date Taking? Authorizing Provider  allopurinol (ZYLOPRIM) 100 MG tablet Take 100 mg by mouth daily.   Yes Historical Provider, MD  amiodarone (PACERONE) 200 MG tablet Take 100 mg by mouth daily.   Yes Historical Provider, MD  aspirin EC 81 MG tablet Take 81 mg by mouth daily.   Yes Historical Provider, MD  bisoprolol-hydrochlorothiazide (ZIAC) 10-6.25 MG per tablet Take 1 tablet by mouth daily.   Yes Historical Provider, MD  cephALEXin  (KEFLEX) 500 MG capsule Take 1 capsule (500 mg total) by mouth 2 (two) times daily. 07/25/15  Yes Serita Grit, MD  gabapentin (NEURONTIN) 100 MG capsule Take 200 mg by mouth at bedtime.   Yes Historical Provider, MD  Omega-3 Fatty Acids (FISH OIL PO) Take 1 tablet by mouth every evening.   Yes Historical Provider, MD  pravastatin (PRAVACHOL) 20 MG tablet Take 20 mg by mouth daily.   Yes Historical Provider, MD  HYDROcodone-acetaminophen (NORCO/VICODIN) 5-325 MG per tablet Take 1-2 tablets by mouth every 6 (six) hours as needed for moderate pain. Patient not taking: Reported on 07/28/2015 07/02/15   Fredia Sorrow, MD   BP 179/77 mmHg  Pulse 47  Temp(Src) 98.7 F (37.1 C) (Oral)  Resp 16  SpO2 95% Physical Exam  Constitutional: He appears well-developed and well-nourished.  HENT:  Head: Normocephalic and atraumatic.  Eyes: Pupils are equal, round, and reactive to light.  Cardiovascular: Regular rhythm.   No murmur heard. Bradycardic  Pulmonary/Chest: Effort normal and breath sounds normal. No respiratory distress.  Abdominal: Soft. There is no tenderness. There is no rebound and no guarding.  Musculoskeletal: He exhibits no edema or tenderness.  Neurological: He is alert.  Slight weakness of the right lower extremity. Patient cannot spontaneously open eyes but when his eyes are opened his left eye cannot look beyond midline to the right. Expressive aphasia.   Skin: Skin is warm and  dry.  Psychiatric:  Unable to assess  Nursing note and vitals reviewed.   ED Course  Procedures (including critical care time) Labs Review Labs Reviewed  APTT - Abnormal; Notable for the following:    aPTT 22 (*)    All other components within normal limits  CBC - Abnormal; Notable for the following:    Hemoglobin 11.8 (*)    HCT 37.9 (*)    MCH 24.7 (*)    RDW 17.1 (*)    All other components within normal limits  COMPREHENSIVE METABOLIC PANEL - Abnormal; Notable for the following:    BUN 29 (*)     Creatinine, Ser 1.67 (*)    Calcium 8.6 (*)    Total Protein 5.7 (*)    Albumin 3.1 (*)    GFR calc non Af Amer 33 (*)    GFR calc Af Amer 38 (*)    All other components within normal limits  I-STAT CHEM 8, ED - Abnormal; Notable for the following:    BUN 30 (*)    Creatinine, Ser 1.60 (*)    Calcium, Ion 1.12 (*)    Hemoglobin 12.6 (*)    HCT 37.0 (*)    All other components within normal limits  ETHANOL  PROTIME-INR  DIFFERENTIAL  URINE RAPID DRUG SCREEN, HOSP PERFORMED  URINALYSIS, ROUTINE W REFLEX MICROSCOPIC (NOT AT [facility])  I-STAT TROPOININ, ED    Imaging Review Ct Head Wo Contrast  07/28/2015   CLINICAL DATA:  Code stroke. Small down after waking up from that. Right-sided deficits.  EXAM: CT HEAD WITHOUT CONTRAST  TECHNIQUE: Contiguous axial images were obtained from the base of the skull through the vertex without intravenous contrast.  COMPARISON:  None.  FINDINGS: Ventricles and sulci are prominent compatible with atrophy. Focal hypodensity within the left thalamus, most compatible with remote infarct. Left frontal parietal lobe low-attenuation (image 22; series 21). No evidence for acute intracranial hemorrhage or significant mass effect. Paranasal sinuses are well aerated. Mastoid air cells unremarkable. Calvarium is intact. Prominent arachnoid granulations within the occipital bone.  IMPRESSION: Regional low attenuation within the left frontal parietal lobe may represent age-indeterminate, potentially subacute or infarct. Chronic left alignment lacunar infarct.  Critical Value/emergent results were called by telephone at the time of interpretation on 07/28/2015 at 7:11 pm to Dr. Doy Mince, who verbally acknowledged these results.   Electronically Signed   By: Lovey Newcomer M.D.   On: 07/28/2015 19:19   I have personally reviewed and evaluated these images and lab results as part of my medical decision-making.   EKG Interpretation None      MDM   Final diagnoses:  CVA  (cerebral infarction)  CVA (cerebral infarction)   patient here for evaluation of dizziness, weakness, change in his speech. Presentation is concerning for acute CVA. Patient was activated as a code stroke prior to ED arrival. He is not a TPA candidate due to unclear duration of the symptoms, advanced age. Patient has been evaluated by Dr. Doy Mince with neurology. Discussed with medicine regarding admission for further management of CVA.  Quintella Reichert, MD 07/29/15 [phone]

## 2015-07-29 ENCOUNTER — Encounter (HOSPITAL_COMMUNITY): Payer: Self-pay | Admitting: *Deleted

## 2015-07-29 ENCOUNTER — Inpatient Hospital Stay (HOSPITAL_COMMUNITY): Payer: PPO

## 2015-07-29 DIAGNOSIS — N3000 Acute cystitis without hematuria: Secondary | ICD-10-CM

## 2015-07-29 DIAGNOSIS — I6931 Cognitive deficits following cerebral infarction: Secondary | ICD-10-CM

## 2015-07-29 DIAGNOSIS — I6340 Cerebral infarction due to embolism of unspecified cerebral artery: Principal | ICD-10-CM

## 2015-07-29 DIAGNOSIS — I639 Cerebral infarction, unspecified: Secondary | ICD-10-CM

## 2015-07-29 DIAGNOSIS — I10 Essential (primary) hypertension: Secondary | ICD-10-CM

## 2015-07-29 DIAGNOSIS — I6789 Other cerebrovascular disease: Secondary | ICD-10-CM

## 2015-07-29 LAB — CBC
HEMATOCRIT: 41.6 % (ref 39.0–52.0)
HEMOGLOBIN: 12.9 g/dL — AB (ref 13.0–17.0)
MCH: 24.5 pg — AB (ref 26.0–34.0)
MCHC: 31 g/dL (ref 30.0–36.0)
MCV: 79.1 fL (ref 78.0–100.0)
Platelets: 213 10*3/uL (ref 150–400)
RBC: 5.26 MIL/uL (ref 4.22–5.81)
RDW: 17.1 % — ABNORMAL HIGH (ref 11.5–15.5)
WBC: 6.3 10*3/uL (ref 4.0–10.5)

## 2015-07-29 LAB — BASIC METABOLIC PANEL
ANION GAP: 8 (ref 5–15)
BUN: 20 mg/dL (ref 6–20)
CO2: 26 mmol/L (ref 22–32)
Calcium: 8.9 mg/dL (ref 8.9–10.3)
Chloride: 107 mmol/L (ref 101–111)
Creatinine, Ser: 1.31 mg/dL — ABNORMAL HIGH (ref 0.61–1.24)
GFR calc Af Amer: 52 mL/min — ABNORMAL LOW (ref >60)
GFR calc non Af Amer: 45 mL/min — ABNORMAL LOW (ref >60)
GLUCOSE: 97 mg/dL (ref 65–99)
POTASSIUM: 3.5 mmol/L (ref 3.5–5.1)
Sodium: 141 mmol/L (ref 135–145)

## 2015-07-29 LAB — RAPID URINE DRUG SCREEN, HOSP PERFORMED
AMPHETAMINES: NOT DETECTED
BARBITURATES: NOT DETECTED
BENZODIAZEPINES: NOT DETECTED
COCAINE: NOT DETECTED
Opiates: NOT DETECTED
Tetrahydrocannabinol: NOT DETECTED

## 2015-07-29 LAB — LIPID PANEL
Cholesterol: 159 mg/dL (ref 0–200)
HDL: 41 mg/dL (ref >40)
LDL CALC: 92 mg/dL (ref 0–99)
Total CHOL/HDL Ratio: 3.9 RATIO
Triglycerides: 130 mg/dL (ref <150)
VLDL: 26 mg/dL (ref 0–40)

## 2015-07-29 LAB — URINALYSIS, ROUTINE W REFLEX MICROSCOPIC
Bilirubin Urine: NEGATIVE
GLUCOSE, UA: NEGATIVE mg/dL
HGB URINE DIPSTICK: NEGATIVE
KETONES UR: NEGATIVE mg/dL
Nitrite: NEGATIVE
PH: 6.5 (ref 5.0–8.0)
PROTEIN: NEGATIVE mg/dL
Specific Gravity, Urine: 1.017 (ref 1.005–1.030)
Urobilinogen, UA: 0.2 mg/dL (ref 0.0–1.0)

## 2015-07-29 LAB — URINE MICROSCOPIC-ADD ON

## 2015-07-29 MED ORDER — SODIUM CHLORIDE 0.9 % IV SOLN
INTRAVENOUS | Status: AC
  Administered 2015-07-29: 02:00:00 via INTRAVENOUS

## 2015-07-29 MED ORDER — SODIUM CHLORIDE 0.9 % IJ SOLN
3.0000 mL | Freq: Two times a day (BID) | INTRAMUSCULAR | Status: DC
Start: 2015-07-29 — End: 2015-07-31
  Administered 2015-07-29: 10 mL via INTRAVENOUS
  Administered 2015-07-29 – 2015-07-31 (×4): 3 mL via INTRAVENOUS

## 2015-07-29 MED ORDER — PRAVASTATIN SODIUM 20 MG PO TABS
20.0000 mg | ORAL_TABLET | Freq: Every day | ORAL | Status: DC
  Administered 2015-07-29 – 2015-07-30 (×2): 20 mg via ORAL
  Filled 2015-07-29 (×2): qty 1

## 2015-07-29 MED ORDER — STROKE: EARLY STAGES OF RECOVERY BOOK
Freq: Once | Status: DC
  Filled 2015-07-29: qty 1

## 2015-07-29 MED ORDER — ASPIRIN 325 MG PO TABS
325.0000 mg | ORAL_TABLET | Freq: Every day | ORAL | Status: DC
  Administered 2015-07-29 – 2015-07-30 (×3): 325 mg via ORAL
  Filled 2015-07-29 (×4): qty 1

## 2015-07-29 MED ORDER — CEPHALEXIN 500 MG PO CAPS
500.0000 mg | ORAL_CAPSULE | Freq: Two times a day (BID) | ORAL | Status: DC
  Administered 2015-07-29 – 2015-07-31 (×4): 500 mg via ORAL
  Filled 2015-07-29 (×4): qty 1

## 2015-07-29 MED ORDER — CHLORHEXIDINE GLUCONATE 0.12 % MT SOLN
15.0000 mL | Freq: Two times a day (BID) | OROMUCOSAL | Status: DC
  Administered 2015-07-29 – 2015-07-31 (×6): 15 mL via OROMUCOSAL
  Filled 2015-07-29 (×6): qty 15

## 2015-07-29 MED ORDER — ASPIRIN 300 MG RE SUPP: 300.0000 mg | Freq: Every day | RECTAL | Status: DC

## 2015-07-29 MED ORDER — CEPHALEXIN 500 MG PO CAPS: 500.0000 mg | ORAL_CAPSULE | Freq: Two times a day (BID) | ORAL | Status: DC

## 2015-07-29 MED ORDER — AMIODARONE HCL 200 MG PO TABS
100.0000 mg | ORAL_TABLET | Freq: Every day | ORAL | Status: DC
  Administered 2015-07-29 – 2015-07-31 (×3): 100 mg via ORAL
  Filled 2015-07-29 (×4): qty 1

## 2015-07-29 MED ORDER — CETYLPYRIDINIUM CHLORIDE 0.05 % MT LIQD
7.0000 mL | Freq: Two times a day (BID) | OROMUCOSAL | Status: DC
  Administered 2015-07-29 – 2015-07-31 (×2): 7 mL via OROMUCOSAL

## 2015-07-29 MED ORDER — INFLUENZA VAC SPLIT QUAD 0.5 ML IM SUSY
0.5000 mL | PREFILLED_SYRINGE | INTRAMUSCULAR | Status: AC
Start: 2015-07-30 — End: 2015-07-30
  Administered 2015-07-30: 0.5 mL via INTRAMUSCULAR
  Filled 2015-07-29: qty 0.5

## 2015-07-29 MED ORDER — ENOXAPARIN SODIUM 30 MG/0.3ML ~~LOC~~ SOLN
30.0000 mg | SUBCUTANEOUS | Status: DC
  Administered 2015-07-29 – 2015-07-30 (×2): 30 mg via SUBCUTANEOUS
  Filled 2015-07-29 (×2): qty 0.3

## 2015-07-29 MED ORDER — ACETAMINOPHEN 650 MG RE SUPP: 650.0000 mg | Freq: Four times a day (QID) | RECTAL | Status: DC | PRN

## 2015-07-29 MED ORDER — ACETAMINOPHEN 325 MG PO TABS: 650.0000 mg | ORAL_TABLET | Freq: Four times a day (QID) | ORAL | Status: DC | PRN

## 2015-07-29 NOTE — Progress Notes (Signed)
Rehab Admissions Coordinator Note:  Patient was screened by Cleatrice Burke for appropriateness for an Inpatient Acute Rehab Consult per PT recommendation.  At this time, we are recommending Inpatient Rehab consult. I will contact Dr. Dareen Piano for an order.  Cleatrice Burke 07/29/2015, 1:43 PM  I can be reached at [phone].

## 2015-07-29 NOTE — Consult Note (Signed)
Physical Medicine and Rehabilitation Consult Reason for Consult: Acute nonhemorrhagic infarct left paramedian midbrain left thalamus and left occipital white matter Referring Physician: [facility]   HPI: Charles [name] is a 81 y.o. right handed male with history of hypertension, arrhythmia maintained on amiodarone, prostate cancer with suprapubic catheter. Patiently recently moved from [place] with his wife to be closer to their son and lives independent living at [facility]. He used a walker prior to admission. Presented 07/28/2015 with right sided weakness generalized decline over the past few weeks and mixed confusion. Recently treated for UTI. MRI of the brain showed small acute nonhemorrhagic infarct left paramedian midbrain, left thalamus and left occipital white matter. MRA of the head with atherosclerotic type changes. Echocardiogram with ejection fraction of 20% grade 1 diastolic dysfunction without emboli. Carotid Dopplers with no ICA stenosis. A she did not receive TPA. Placed on aspirin for CVA prophylaxis as well as subcutaneous Lovenox for DVT prophylaxis. Physical therapy evaluation completed 07/29/2015 with recommendations of physical medicine and rehabilitation consult.  Patient is in bed states he is tired and cannot sleep. Cannot tell me where he is other than in [place]  Review of Systems  Constitutional: Negative for fever and chills.  HENT: Positive for hearing loss.   Eyes: Negative for blurred vision and double vision.  Cardiovascular: Positive for palpitations and leg swelling. Negative for chest pain.  Gastrointestinal: Positive for constipation. Negative for nausea and vomiting.  Genitourinary: Positive for urgency. Negative for dysuria and hematuria.  Musculoskeletal: Positive for myalgias and joint pain.  Skin: Negative for itching and rash.  Neurological: Positive for weakness. Negative for headaches.   Past Medical History  Diagnosis Date  .  Hypertension   . Prostate enlargement   . Stroke     no residual weakness   Past Surgical History  Procedure Laterality Date  . Suprapubic catheter insertion     History reviewed. No pertinent family history. Social History:  reports that he has never smoked. He does not have any smokeless tobacco history on file. He reports that he does not drink alcohol or use illicit drugs. Allergies: No Known Allergies Medications Prior to Admission  Medication Sig Dispense Refill  . allopurinol (ZYLOPRIM) 100 MG tablet Take 100 mg by mouth daily.    Marland Kitchen amiodarone (PACERONE) 200 MG tablet Take 100 mg by mouth daily.    Marland Kitchen aspirin EC 81 MG tablet Take 81 mg by mouth daily.    . bisoprolol-hydrochlorothiazide (ZIAC) 10-6.25 MG per tablet Take 1 tablet by mouth daily.    . cephALEXin (KEFLEX) 500 MG capsule Take 1 capsule (500 mg total) by mouth 2 (two) times daily. 14 capsule 0  . gabapentin (NEURONTIN) 100 MG capsule Take 200 mg by mouth at bedtime.    . Omega-3 Fatty Acids (FISH OIL PO) Take 1 tablet by mouth every evening.    . pravastatin (PRAVACHOL) 20 MG tablet Take 20 mg by mouth daily.    Marland Kitchen HYDROcodone-acetaminophen (NORCO/VICODIN) 5-325 MG per tablet Take 1-2 tablets by mouth every 6 (six) hours as needed for moderate pain. (Patient not taking: Reported on 07/28/2015) 20 tablet 0    Home: Home Living Family/patient expects to be discharged to:: Assisted living Home Equipment: Walker - 4 wheels  Functional History: Prior Function Comments: Per pt he amb with rollator. Functional Status:  Mobility: Bed Mobility Overal bed mobility: Needs Assistance Bed Mobility: Sit to Supine, Sidelying to Sit Sidelying to sit: Min assist, HOB elevated Sit  to supine: Min assist General bed mobility comments: Assist to bring trunk up and to EOB Transfers Overall transfer level: Needs assistance Equipment used: 4-wheeled walker Transfers: Sit to/from Stand Sit to Stand: Mod assist General transfer  comment: Assist to bring hips up and for balance. Pt with rt lean.      ADL:    Cognition: Cognition Overall Cognitive Status: No family/caregiver present to determine baseline cognitive functioning Orientation Level: Oriented to person, Disoriented to place, Disoriented to time Cognition Arousal/Alertness: Awake/alert Behavior During Therapy: WFL for tasks assessed/performed Overall Cognitive Status: No family/caregiver present to determine baseline cognitive functioning  Blood pressure 183/72, pulse 46, temperature 98 F (36.7 C), temperature source Oral, resp. rate 20, height 6\' 1"  (1.854 m), weight 71.8 kg (158 lb 4.6 oz), SpO2 97 %. Physical Exam  HENT:  Head: Normocephalic.  Eyes:  Pupils round and reactive to light  Neck: Normal range of motion. Neck supple. No thyromegaly present.  Cardiovascular:  Cardiac rate controlled  Respiratory: Effort normal and breath sounds normal. No respiratory distress.  GI: Soft. Bowel sounds are normal. He exhibits no distension.  Genitourinary:  Suprapubic catheter in place  Neurological: He is alert.  Patient prefers to keep his eyes closed during exam. He was able to provide his name and age. He was limited to place and living situation. Follows simple commands  Skin: Skin is warm and dry.  Motor strength is 4 minus bilateral deltoid, biceps, triceps, grip, hip flexor, knee extensor, ankle dorsal flexor plantar flexor He is oriented to person, city, not to situation unaware that he had a stroke unaware he was in a hospital  Results for orders placed or performed during the hospital encounter of 07/28/15 (from the past 24 hour(s))  Ethanol     Status: None   Collection Time: 07/28/15  6:58 PM  Result Value Ref Range   Alcohol, Ethyl (B) <5 <5 mg/dL  Protime-INR     Status: None   Collection Time: 07/28/15  6:58 PM  Result Value Ref Range   Prothrombin Time 14.1 11.6 - 15.2 seconds   INR 1.07 0.00 - 1.49  APTT     Status: Abnormal    Collection Time: 07/28/15  6:58 PM  Result Value Ref Range   aPTT 22 (L) 24 - 37 seconds  CBC     Status: Abnormal   Collection Time: 07/28/15  6:58 PM  Result Value Ref Range   WBC 5.6 4.0 - 10.5 K/uL   RBC 4.78 4.22 - 5.81 MIL/uL   Hemoglobin 11.8 (L) 13.0 - 17.0 g/dL   HCT 37.9 (L) 39.0 - 52.0 %   MCV 79.3 78.0 - 100.0 fL   MCH 24.7 (L) 26.0 - 34.0 pg   MCHC 31.1 30.0 - 36.0 g/dL   RDW 17.1 (H) 11.5 - 15.5 %   Platelets 187 150 - 400 K/uL  Differential     Status: None   Collection Time: 07/28/15  6:58 PM  Result Value Ref Range   Neutrophils Relative % 77 43 - 77 %   Neutro Abs 4.4 1.7 - 7.7 K/uL   Lymphocytes Relative 13 12 - 46 %   Lymphs Abs 0.7 0.7 - 4.0 K/uL   Monocytes Relative 7 3 - 12 %   Monocytes Absolute 0.4 0.1 - 1.0 K/uL   Eosinophils Relative 2 0 - 5 %   Eosinophils Absolute 0.1 0.0 - 0.7 K/uL   Basophils Relative 1 0 - 1 %   Basophils  Absolute 0.0 0.0 - 0.1 K/uL  Comprehensive metabolic panel     Status: Abnormal   Collection Time: 07/28/15  6:58 PM  Result Value Ref Range   Sodium 137 135 - 145 mmol/L   Potassium 3.7 3.5 - 5.1 mmol/L   Chloride 105 101 - 111 mmol/L   CO2 24 22 - 32 mmol/L   Glucose, Bld 97 65 - 99 mg/dL   BUN 29 (H) 6 - 20 mg/dL   Creatinine, Ser 1.67 (H) 0.61 - 1.24 mg/dL   Calcium 8.6 (L) 8.9 - 10.3 mg/dL   Total Protein 5.7 (L) 6.5 - 8.1 g/dL   Albumin 3.1 (L) 3.5 - 5.0 g/dL   AST 26 15 - 41 U/L   ALT 26 17 - 63 U/L   Alkaline Phosphatase 59 38 - 126 U/L   Total Bilirubin 0.7 0.3 - 1.2 mg/dL   GFR calc non Af Amer 33 (L) >60 mL/min   GFR calc Af Amer 38 (L) >60 mL/min   Anion gap 8 5 - 15  I-stat troponin, ED (not at [facility], [facility])     Status: None   Collection Time: 07/28/15  7:34 PM  Result Value Ref Range   Troponin i, poc 0.02 0.00 - 0.08 ng/mL   Comment 3          I-Stat Chem 8, ED  (not at [facility], [facility])     Status: Abnormal   Collection Time: 07/28/15  7:36 PM  Result Value Ref Range   Sodium 140 135 - 145 mmol/L    Potassium 3.7 3.5 - 5.1 mmol/L   Chloride 105 101 - 111 mmol/L   BUN 30 (H) 6 - 20 mg/dL   Creatinine, Ser 1.60 (H) 0.61 - 1.24 mg/dL   Glucose, Bld 93 65 - 99 mg/dL   Calcium, Ion 1.12 (L) 1.13 - 1.30 mmol/L   TCO2 21 0 - 100 mmol/L   Hemoglobin 12.6 (L) 13.0 - 17.0 g/dL   HCT 37.0 (L) 39.0 - 52.0 %  Urine rapid drug screen (hosp performed)not at [facility]     Status: None   Collection Time: 07/29/15  4:52 AM  Result Value Ref Range   Opiates NONE DETECTED NONE DETECTED   Cocaine NONE DETECTED NONE DETECTED   Benzodiazepines NONE DETECTED NONE DETECTED   Amphetamines NONE DETECTED NONE DETECTED   Tetrahydrocannabinol NONE DETECTED NONE DETECTED   Barbiturates NONE DETECTED NONE DETECTED  Urinalysis, Routine w reflex microscopic (not at [facility])     Status: Abnormal   Collection Time: 07/29/15  4:52 AM  Result Value Ref Range   Color, Urine YELLOW YELLOW   APPearance CLEAR CLEAR   Specific Gravity, Urine 1.017 1.005 - 1.030   pH 6.5 5.0 - 8.0   Glucose, UA NEGATIVE NEGATIVE mg/dL   Hgb urine dipstick NEGATIVE NEGATIVE   Bilirubin Urine NEGATIVE NEGATIVE   Ketones, ur NEGATIVE NEGATIVE mg/dL   Protein, ur NEGATIVE NEGATIVE mg/dL   Urobilinogen, UA 0.2 0.0 - 1.0 mg/dL   Nitrite NEGATIVE NEGATIVE   Leukocytes, UA MODERATE (A) NEGATIVE  Urine microscopic-add on     Status: None   Collection Time: 07/29/15  4:52 AM  Result Value Ref Range   Squamous Epithelial / LPF RARE RARE   WBC, UA 21-50 <3 WBC/hpf   RBC / HPF 0-2 <3 RBC/hpf   Bacteria, UA RARE RARE   Urine-Other MUCOUS PRESENT   Basic metabolic panel     Status: Abnormal   Collection Time: 07/29/15  11:46 AM  Result Value Ref Range   Sodium 141 135 - 145 mmol/L   Potassium 3.5 3.5 - 5.1 mmol/L   Chloride 107 101 - 111 mmol/L   CO2 26 22 - 32 mmol/L   Glucose, Bld 97 65 - 99 mg/dL   BUN 20 6 - 20 mg/dL   Creatinine, Ser 1.31 (H) 0.61 - 1.24 mg/dL   Calcium 8.9 8.9 - 10.3 mg/dL   GFR calc non Af Amer 45 (L) >60 mL/min   GFR  calc Af Amer 52 (L) >60 mL/min   Anion gap 8 5 - 15  CBC     Status: Abnormal   Collection Time: 07/29/15 11:46 AM  Result Value Ref Range   WBC 6.3 4.0 - 10.5 K/uL   RBC 5.26 4.22 - 5.81 MIL/uL   Hemoglobin 12.9 (L) 13.0 - 17.0 g/dL   HCT 41.6 39.0 - 52.0 %   MCV 79.1 78.0 - 100.0 fL   MCH 24.5 (L) 26.0 - 34.0 pg   MCHC 31.0 30.0 - 36.0 g/dL   RDW 17.1 (H) 11.5 - 15.5 %   Platelets 213 150 - 400 K/uL  Lipid panel     Status: None   Collection Time: 07/29/15 11:46 AM  Result Value Ref Range   Cholesterol 159 0 - 200 mg/dL   Triglycerides 130 <150 mg/dL   HDL 41 >40 mg/dL   Total CHOL/HDL Ratio 3.9 RATIO   VLDL 26 0 - 40 mg/dL   LDL Cholesterol 92 0 - 99 mg/dL   Ct Head Wo Contrast  07/28/2015   CLINICAL DATA:  Code stroke. Small down after waking up from that. Right-sided deficits.  EXAM: CT HEAD WITHOUT CONTRAST  TECHNIQUE: Contiguous axial images were obtained from the base of the skull through the vertex without intravenous contrast.  COMPARISON:  None.  FINDINGS: Ventricles and sulci are prominent compatible with atrophy. Focal hypodensity within the left thalamus, most compatible with remote infarct. Left frontal parietal lobe low-attenuation (image 22; series 21). No evidence for acute intracranial hemorrhage or significant mass effect. Paranasal sinuses are well aerated. Mastoid air cells unremarkable. Calvarium is intact. Prominent arachnoid granulations within the occipital bone.  IMPRESSION: Regional low attenuation within the left frontal parietal lobe may represent age-indeterminate, potentially subacute or infarct. Chronic left alignment lacunar infarct.  Critical Value/emergent results were called by telephone at the time of interpretation on 07/28/2015 at 7:11 pm to Dr. Doy Mince, who verbally acknowledged these results.   Electronically Signed   By: Lovey Newcomer M.D.   On: 07/28/2015 19:19   Mr Jodene Nam Head Wo Contrast  07/29/2015   CLINICAL DATA:  4 hours of right-sided weakness  with slurred speech.  EXAM: MRI HEAD WITHOUT CONTRAST  MRA HEAD WITHOUT CONTRAST  TECHNIQUE: Multiplanar, multiecho pulse sequences of the brain and surrounding structures were obtained without intravenous contrast. Angiographic images of the head were obtained using MRA technique without contrast.  COMPARISON:  None.  FINDINGS: MRI HEAD FINDINGS  Motion degraded study.  Calvarium and upper cervical spine: No focal marrow signal abnormality.  Orbits: Bilateral cataract resection.  No acute finding.  Sinuses and Mastoids: Clear. Mastoid and middle ears are clear.  Brain: There are sub cm areas of restricted diffusion in the left paramedian midbrain, including periaqueductal gray matter and along the interpeduncular fossa, lateral left thalamus, and left occipital subcortical white matter.  Remote cortical and subcortical infarct involving the posterior left frontal lobe, central MCA territory. Remote lacunar infarct in  the left thalamus and periatrial white matter on the right.  Probable hygromas in the posterior fossa, without mass effect.  MRA HEAD FINDINGS  Motion degraded study.  Symmetric carotid arteries. Bilateral fetal type PCAs. Symmetric vertebral arteries. Dominant left AICA and right PICA.  There is diffuse medium vessel irregularity and narrowing consistent with atherosclerosis, moderate to advanced. When accounting for motion artifact at slab interface, proximal basilar artery narrowing is likely artifactual. There is mild narrowing of the distal basilar which is likely atherosclerotic. No evidence of intramural hematoma on T1 weighted imaging, as permitted by motion artifact. At the expected location of the left P1 -P2 junction there is a moderate focal stenosis, of note given the pattern of infarctions.  No evidence of aneurysm.  IMPRESSION: 1. Small acute nonhemorrhagic infarcts in the left paramedian midbrain, left thalamus, and left occipital white matter. 2. As permitted by motion, no acute  arterial finding. The bilateral posterior cerebral arteries are fetal type. 3. Diffuse intracranial atherosclerosis without treatable flow limiting stenosis.   Electronically Signed   By: Monte Fantasia M.D.   On: 07/29/2015 10:11   Mr Brain Wo Contrast  07/29/2015   CLINICAL DATA:  4 hours of right-sided weakness with slurred speech.  EXAM: MRI HEAD WITHOUT CONTRAST  MRA HEAD WITHOUT CONTRAST  TECHNIQUE: Multiplanar, multiecho pulse sequences of the brain and surrounding structures were obtained without intravenous contrast. Angiographic images of the head were obtained using MRA technique without contrast.  COMPARISON:  None.  FINDINGS: MRI HEAD FINDINGS  Motion degraded study.  Calvarium and upper cervical spine: No focal marrow signal abnormality.  Orbits: Bilateral cataract resection.  No acute finding.  Sinuses and Mastoids: Clear. Mastoid and middle ears are clear.  Brain: There are sub cm areas of restricted diffusion in the left paramedian midbrain, including periaqueductal gray matter and along the interpeduncular fossa, lateral left thalamus, and left occipital subcortical white matter.  Remote cortical and subcortical infarct involving the posterior left frontal lobe, central MCA territory. Remote lacunar infarct in the left thalamus and periatrial white matter on the right.  Probable hygromas in the posterior fossa, without mass effect.  MRA HEAD FINDINGS  Motion degraded study.  Symmetric carotid arteries. Bilateral fetal type PCAs. Symmetric vertebral arteries. Dominant left AICA and right PICA.  There is diffuse medium vessel irregularity and narrowing consistent with atherosclerosis, moderate to advanced. When accounting for motion artifact at slab interface, proximal basilar artery narrowing is likely artifactual. There is mild narrowing of the distal basilar which is likely atherosclerotic. No evidence of intramural hematoma on T1 weighted imaging, as permitted by motion artifact. At the  expected location of the left P1 -P2 junction there is a moderate focal stenosis, of note given the pattern of infarctions.  No evidence of aneurysm.  IMPRESSION: 1. Small acute nonhemorrhagic infarcts in the left paramedian midbrain, left thalamus, and left occipital white matter. 2. As permitted by motion, no acute arterial finding. The bilateral posterior cerebral arteries are fetal type. 3. Diffuse intracranial atherosclerosis without treatable flow limiting stenosis.   Electronically Signed   By: Monte Fantasia M.D.   On: 07/29/2015 10:11    Assessment/Plan: Diagnosis: Left paramedian midbrain thalamic infarct no apparent motor weakness. Cognitive deficits. 1. Does the need for close, 24 hr/day medical supervision in concert with the patient's rehab needs make it unreasonable for this patient to be served in a less intensive setting? No 2. Co-Morbidities requiring supervision/potential complications: Prostate carcinoma with chronic suprapubic catheter 3. Due  to bladder management, bowel management, safety, skin/wound care, disease management and medication administration, does the patient require 24 hr/day rehab nursing? Potentially 4. Does the patient require coordinated care of a physician, rehab nurse, PT and OT one hour per day each to address physical and functional deficits in the context of the above medical diagnosis(es)? No Addressing deficits in the following areas: balance, endurance, locomotion, strength, transferring, bowel/bladder control, bathing, dressing, feeding, grooming, toileting and cognition 5. Can the patient actively participate in an intensive therapy program of at least 3 hrs of therapy per day at least 5 days per week? No 6. The potential for patient to make measurable gains while on inpatient rehab is poor 7. Anticipated functional outcomes upon discharge from inpatient rehab are n/a  with PT, n/a with OT, n/a with SLP. 8. Estimated rehab length of stay to reach the  above functional goals is: Not applicable 9. Does the patient have adequate social supports and living environment to accommodate these discharge functional goals? Potentially 10. Anticipated D/C setting: Other 11. Anticipated post D/C treatments: Will likely need assisted living after skilled nursing 12. Overall Rehab/Functional Prognosis: fair  RECOMMENDATIONS: This patient's condition is appropriate for continued rehabilitative care in the following setting: SNF Patient has agreed to participate in recommended program. N/A Note that insurance prior authorization may be required for reimbursement for recommended care.  Comment:     07/29/2015

## 2015-07-29 NOTE — Care Management Note (Signed)
Case Management Note  Patient Details  Name: Myer Bohlman MRN: [id number] Date of Birth: 1918-03-12  Subjective/Objective:       Patient is from [place], active with [facility].  Per pt eval rec CIR.  CIR consult has been ordered.             Action/Plan:   Expected Discharge Date:                  Expected Discharge Plan:  IP Rehab Facility  In-House Referral:  Clinical Social Work  Discharge planning Services  CM Consult  Post Acute Care Choice:  IP Rehab Choice offered to:     DME Arranged:    DME Agency:     HH Arranged:    [place] Agency:     Status of Service:  In process, will continue to follow  Medicare Important Message Given:    Date Medicare IM Given:    Medicare IM give by:    Date Additional Medicare IM Given:    Additional Medicare Important Message give by:     If discussed at [place] of Stay Meetings, dates discussed:    Additional Comments:  Zenon Mayo, RN 07/29/2015, 3:38 PM

## 2015-07-29 NOTE — Progress Notes (Signed)
Subjective: Patient seen after returning from MRI.  No new complaints right now.  Complaining of generalized pain but nothing acute.  Patients son states that patient is independent prior to this most recent admission.  Objective: Vital signs in last 24 hours: Filed Vitals:   07/29/15 0320 07/29/15 0415 07/29/15 0636 07/29/15 1345  BP: 151/62 165/82 169/82 183/72  Pulse: 44 42 45 46  Temp: 98.1 F (36.7 C) 97.9 F (36.6 C) 97.8 F (36.6 C) 98 F (36.7 C)  TempSrc: Oral Oral Oral Oral  Resp: 18 18 18 20   Height:      Weight:      SpO2: 97% 95% 96% 97%   Weight change:   Intake/Output Summary (Last 24 hours) at 07/29/15 1534 Last data filed at 07/29/15 0900  Gross per 24 hour  Intake      0 ml  Output    700 ml  Net   -700 ml   General: resting in bed, no distress HEENT: no scleral icterus Cardiac: bradycardic, regular rhythm Pulm: clear to auscultation bilaterally Abd: soft, nontender, nondistended, BS present Ext: warm and well perfused, no pedal edema Neuro: alert and oriented.  Knows that he is in the hospital and what the year and month is. Left eye deficit on medial gaze that does not cross the midline.  Strength 4/5 lower extremities, 5/5 upper extremities  Lab Results: Basic Metabolic Panel:  Recent Labs Lab 07/28/15 1858 07/28/15 1936 07/29/15 1146  NA 137 140 141  K 3.7 3.7 3.5  CL 105 105 107  CO2 24  --  26  GLUCOSE 97 93 97  BUN 29* 30* 20  CREATININE 1.67* 1.60* 1.31*  CALCIUM 8.6*  --  8.9   Liver Function Tests:  Recent Labs Lab 07/25/15 1705 07/28/15 1858  AST 35 26  ALT 30 26  ALKPHOS 60 59  BILITOT 0.7 0.7  PROT 6.4* 5.7*  ALBUMIN 3.3* 3.1*   CBC:  Recent Labs Lab 07/25/15 1705 07/28/15 1858 07/28/15 1936 07/29/15 1146  WBC 6.4 5.6  --  6.3  NEUTROABS 5.0 4.4  --   --   HGB 12.1* 11.8* 12.6* 12.9*  HCT 39.4 37.9* 37.0* 41.6  MCV 79.6 79.3  --  79.1  PLT 256 187  --  213   Cardiac Enzymes:  Recent Labs Lab  07/25/15 2056  TROPONINI <0.03   Fasting Lipid Panel:  Recent Labs Lab 07/29/15 1146  CHOL 159  HDL 41  LDLCALC 92  TRIG 130  CHOLHDL 3.9   Coagulation:  Recent Labs Lab 07/28/15 1858  LABPROT 14.1  INR 1.07   Anemia Panel: No results for input(s): VITAMINB12, FOLATE, FERRITIN, TIBC, IRON, RETICCTPCT in the last 168 hours. Urine Drug Screen: Drugs of Abuse     Component Value Date/Time   LABOPIA NONE DETECTED 07/29/2015 0452   COCAINSCRNUR NONE DETECTED 07/29/2015 0452   LABBENZ NONE DETECTED 07/29/2015 0452   AMPHETMU NONE DETECTED 07/29/2015 0452   THCU NONE DETECTED 07/29/2015 0452   LABBARB NONE DETECTED 07/29/2015 0452    Alcohol Level:  Recent Labs Lab 07/28/15 1858  ETH <5   Urinalysis:  Recent Labs Lab 07/25/15 1658 07/29/15 0452  COLORURINE YELLOW YELLOW  LABSPEC 1.016 1.017  PHURINE 5.5 6.5  GLUCOSEU NEGATIVE NEGATIVE  HGBUR MODERATE* NEGATIVE  BILIRUBINUR NEGATIVE NEGATIVE  KETONESUR NEGATIVE NEGATIVE  PROTEINUR 30* NEGATIVE  UROBILINOGEN 0.2 0.2  NITRITE NEGATIVE NEGATIVE  LEUKOCYTESUR MODERATE* MODERATE*     Micro Results: Recent  Results (from the past 240 hour(s))  Urine culture     Status: None   Collection Time: 07/25/15  5:26 PM  Result Value Ref Range Status   Specimen Description URINE, RANDOM  Final   Special Requests NONE  Final   Culture NO GROWTH 1 DAY  Final   Report Status 07/26/2015 FINAL  Final   Studies/Results: Ct Head Wo Contrast  07/28/2015   CLINICAL DATA:  Code stroke. Small down after waking up from that. Right-sided deficits.  EXAM: CT HEAD WITHOUT CONTRAST  TECHNIQUE: Contiguous axial images were obtained from the base of the skull through the vertex without intravenous contrast.  COMPARISON:  None.  FINDINGS: Ventricles and sulci are prominent compatible with atrophy. Focal hypodensity within the left thalamus, most compatible with remote infarct. Left frontal parietal lobe low-attenuation (image 22; series  21). No evidence for acute intracranial hemorrhage or significant mass effect. Paranasal sinuses are well aerated. Mastoid air cells unremarkable. Calvarium is intact. Prominent arachnoid granulations within the occipital bone.  IMPRESSION: Regional low attenuation within the left frontal parietal lobe may represent age-indeterminate, potentially subacute or infarct. Chronic left alignment lacunar infarct.  Critical Value/emergent results were called by telephone at the time of interpretation on 07/28/2015 at 7:11 pm to Dr. Doy Mince, who verbally acknowledged these results.   Electronically Signed   By: Lovey Newcomer M.D.   On: 07/28/2015 19:19   Mr Jodene Nam Head Wo Contrast  07/29/2015   CLINICAL DATA:  4 hours of right-sided weakness with slurred speech.  EXAM: MRI HEAD WITHOUT CONTRAST  MRA HEAD WITHOUT CONTRAST  TECHNIQUE: Multiplanar, multiecho pulse sequences of the brain and surrounding structures were obtained without intravenous contrast. Angiographic images of the head were obtained using MRA technique without contrast.  COMPARISON:  None.  FINDINGS: MRI HEAD FINDINGS  Motion degraded study.  Calvarium and upper cervical spine: No focal marrow signal abnormality.  Orbits: Bilateral cataract resection.  No acute finding.  Sinuses and Mastoids: Clear. Mastoid and middle ears are clear.  Brain: There are sub cm areas of restricted diffusion in the left paramedian midbrain, including periaqueductal gray matter and along the interpeduncular fossa, lateral left thalamus, and left occipital subcortical white matter.  Remote cortical and subcortical infarct involving the posterior left frontal lobe, central MCA territory. Remote lacunar infarct in the left thalamus and periatrial white matter on the right.  Probable hygromas in the posterior fossa, without mass effect.  MRA HEAD FINDINGS  Motion degraded study.  Symmetric carotid arteries. Bilateral fetal type PCAs. Symmetric vertebral arteries. Dominant left AICA and  right PICA.  There is diffuse medium vessel irregularity and narrowing consistent with atherosclerosis, moderate to advanced. When accounting for motion artifact at slab interface, proximal basilar artery narrowing is likely artifactual. There is mild narrowing of the distal basilar which is likely atherosclerotic. No evidence of intramural hematoma on T1 weighted imaging, as permitted by motion artifact. At the expected location of the left P1 -P2 junction there is a moderate focal stenosis, of note given the pattern of infarctions.  No evidence of aneurysm.  IMPRESSION: 1. Small acute nonhemorrhagic infarcts in the left paramedian midbrain, left thalamus, and left occipital white matter. 2. As permitted by motion, no acute arterial finding. The bilateral posterior cerebral arteries are fetal type. 3. Diffuse intracranial atherosclerosis without treatable flow limiting stenosis.   Electronically Signed   By: Monte Fantasia M.D.   On: 07/29/2015 10:11   Mr Brain Wo Contrast  07/29/2015   CLINICAL  DATA:  4 hours of right-sided weakness with slurred speech.  EXAM: MRI HEAD WITHOUT CONTRAST  MRA HEAD WITHOUT CONTRAST  TECHNIQUE: Multiplanar, multiecho pulse sequences of the brain and surrounding structures were obtained without intravenous contrast. Angiographic images of the head were obtained using MRA technique without contrast.  COMPARISON:  None.  FINDINGS: MRI HEAD FINDINGS  Motion degraded study.  Calvarium and upper cervical spine: No focal marrow signal abnormality.  Orbits: Bilateral cataract resection.  No acute finding.  Sinuses and Mastoids: Clear. Mastoid and middle ears are clear.  Brain: There are sub cm areas of restricted diffusion in the left paramedian midbrain, including periaqueductal gray matter and along the interpeduncular fossa, lateral left thalamus, and left occipital subcortical white matter.  Remote cortical and subcortical infarct involving the posterior left frontal lobe, central MCA  territory. Remote lacunar infarct in the left thalamus and periatrial white matter on the right.  Probable hygromas in the posterior fossa, without mass effect.  MRA HEAD FINDINGS  Motion degraded study.  Symmetric carotid arteries. Bilateral fetal type PCAs. Symmetric vertebral arteries. Dominant left AICA and right PICA.  There is diffuse medium vessel irregularity and narrowing consistent with atherosclerosis, moderate to advanced. When accounting for motion artifact at slab interface, proximal basilar artery narrowing is likely artifactual. There is mild narrowing of the distal basilar which is likely atherosclerotic. No evidence of intramural hematoma on T1 weighted imaging, as permitted by motion artifact. At the expected location of the left P1 -P2 junction there is a moderate focal stenosis, of note given the pattern of infarctions.  No evidence of aneurysm.  IMPRESSION: 1. Small acute nonhemorrhagic infarcts in the left paramedian midbrain, left thalamus, and left occipital white matter. 2. As permitted by motion, no acute arterial finding. The bilateral posterior cerebral arteries are fetal type. 3. Diffuse intracranial atherosclerosis without treatable flow limiting stenosis.   Electronically Signed   By: Monte Fantasia M.D.   On: 07/29/2015 10:11   Medications: Scheduled Meds: .  stroke: mapping our early stages of recovery book   Does not apply Once  . amiodarone  100 mg Oral Daily  . antiseptic oral rinse  7 mL Mouth Rinse q12n4p  . aspirin  325 mg Oral Daily   Or  . aspirin  300 mg Rectal Daily  . chlorhexidine  15 mL Mouth Rinse BID  . enoxaparin (LOVENOX) injection  30 mg Subcutaneous Q24H  . [START ON 07/30/2015] Influenza vac split quadrivalent PF  0.5 mL Intramuscular Tomorrow-1000  . pravastatin  20 mg Oral Daily  . sodium chloride  3 mL Intravenous Q12H   Continuous Infusions:  PRN Meds:.acetaminophen **OR** acetaminophen Assessment/Plan: Active Problems:   CVA (cerebral  infarction)  Charles [name] is a 81 y.o. male with history of hypertension, stroke, arrhythmia (on amiodarone), BPH with suprapubic catheter and recent UTI on Keflex.  Stroke: left paramedian, left thalamus, left occipoparital infarcts, embolic 2/2 unclear source but could be due to arrythmia (on amiodarone) -neurology following, appreciate their recommendations -follow up neuro recs -MRI: acute left paramedian, left thalamus, and left occipitoparietal infarcts.  Pt also has remote cortical and subcortical infarcts bilaterally, also concerning for embolic source -MRA: diffuse intracranial atheroslerosis -EEG pending -LDL 92 -Hgb A1c pending -Was on aspirin 81mg  prior to admission.  Now on aspirin 325mg  PO daily. -History of irregular heartbeat suspicious for a-fib on amiodarone.  Son working on obtaining records from his cardiologist in [place].  If hx of a-fib confirmed, neuro recommends Eliquis  for anti-coagulation -consult to inpatient rehab  Arrhythmia (concern for atrial fibrillation): history of irregular heart beat, rate controlled on amiodarone -neuro recommending Eliquis if history of a-fib confrimed.  Patients son obtaining records from cardiologist in [place] -continue amiodarone  Hypertension: on bisoprolol-HCTZ at home -hold home meds.  Allowing for permissive HTN (goal BP <220/110 for 24-48 hours post stroke and then gradually normalize within 5-7 days)  Hyperlipidemia: on pravastatin at home -LDL 92 with goal <70 -continue pravstatin  Urinary tract infection: should complete Keflex 500mg  bid on 9/11 -continue Keflex 500mg  PO bid   Dispo: Disposition is deferred at this time, awaiting improvement of current medical problems.  Anticipate discharge to inpatient rehab  The patient does have a current PCP Hayden Rasmussen, MD) and does not need an [facility] hospital follow-up appointment after discharge.  The patient does have transportation limitations that hinder  transportation to clinic appointments.    LOS: 1 day   Jule Ser, DO 07/29/2015, 3:34 PM

## 2015-07-29 NOTE — Evaluation (Signed)
Physical Therapy Evaluation Patient Details Name: Charles [name] MRN: [id number] DOB: [birth year] Today's Date: 07/29/2015   History of Present Illness  Pt adm with rt sided weakness and slurred speech. MRI showed small acute nonhemorrhagic infarcts in the left paramedian midbrain, left thalamus, and left occipital white matter. PMH - HTN, CVA  Clinical Impression  Pt admitted with above diagnosis and presents to PT with functional limitations due to deficits listed below (See PT problem list). Pt needs skilled PT to maximize independence and safety to allow discharge to possibly CIR.      Follow Up Recommendations CIR    Equipment Recommendations  None recommended by PT    Recommendations for Other Services Rehab consult     Precautions / Restrictions Precautions Precautions: Fall      Mobility  Bed Mobility Overal bed mobility: Needs Assistance Bed Mobility: Sit to Supine;Sidelying to Sit   Sidelying to sit: Min assist;HOB elevated   Sit to supine: Min assist   General bed mobility comments: Assist to bring trunk up and to EOB  Transfers Overall transfer level: Needs assistance Equipment used: 4-wheeled walker Transfers: Sit to/from Stand Sit to Stand: Mod assist         General transfer comment: Assist to bring hips up and for balance. Pt with rt lean.  Ambulation/Gait                Stairs            Wheelchair Mobility    Modified Rankin (Stroke Patients Only)       Balance Overall balance assessment: Needs assistance Sitting-balance support: Bilateral upper extremity supported;Feet supported Sitting balance-Leahy Scale: Poor Sitting balance - Comments: Sat EOB x 10 minutes with min guard to min A.  Postural control: Right lateral lean Standing balance support: Bilateral upper extremity supported Standing balance-Leahy Scale: Poor Standing balance comment: Stood x 30 sec with rollator and mod A with rt lateral lean                              Pertinent Vitals/Pain Pain Assessment: No/denies pain    Home Living Family/patient expects to be discharged to:: Assisted living               Home Equipment: Walker - 4 wheels      Prior Function           Comments: Per pt he amb with rollator.     Hand Dominance        Extremity/Trunk Assessment   Upper Extremity Assessment: Defer to OT evaluation           Lower Extremity Assessment: Generalized weakness         Communication   Communication: HOH  Cognition Arousal/Alertness: Awake/alert Behavior During Therapy: WFL for tasks assessed/performed Overall Cognitive Status: No family/caregiver present to determine baseline cognitive functioning                      General Comments      Exercises        Assessment/Plan    PT Assessment Patient needs continued PT services  PT Diagnosis Difficulty walking;Generalized weakness   PT Problem List Decreased strength;Decreased balance;Decreased activity tolerance;Decreased mobility;Decreased knowledge of use of DME  PT Treatment Interventions DME instruction;Gait training;Functional mobility training;Therapeutic activities;Therapeutic exercise;Balance training;Neuromuscular re-education;Patient/family education   PT Goals (Current goals can be found in the Care Plan section) Acute Rehab PT  Goals Patient Stated Goal: Not stated PT Goal Formulation: With patient Time For Goal Achievement: 08/12/15 Potential to Achieve Goals: Good    Frequency Min 4X/week   Barriers to discharge        Co-evaluation               End of Session Equipment Utilized During Treatment: Gait belt Activity Tolerance: Patient tolerated treatment well Patient left: in bed;with call bell/phone within reach;with bed alarm set           Time: 3299-2426 PT Time Calculation (min) (ACUTE ONLY): 21 min   Charges:   PT Evaluation $Initial PT Evaluation Tier I: 1 Procedure     PT G  Codes:        Nayelis Bonito 01-Aug-2015, 1:34 PM  [facility] PT [phone]

## 2015-07-29 NOTE — Progress Notes (Signed)
  Echocardiogram 2D Echocardiogram has been performed.  Charles [name] 07/29/2015, 9:32 AM

## 2015-07-29 NOTE — Progress Notes (Signed)
STROKE TEAM PROGRESS NOTE   SUBJECTIVE (INTERVAL HISTORY) His son is at the bedside.  Overall his son feels pt condition is gradually improving. Pt has been recently struggling with UTI. On keflex prior to admission and making progress.  Son stated that pt on amiodarone is for "irregular heart beat". He had cardiologist in [place]. He also has brief rectal bleeding recently on aleve.    OBJECTIVE Temp:  [97.8 F (36.6 C)-98.7 F (37.1 C)] 98 F (36.7 C) (09/08 1345) Pulse Rate:  [42-47] 46 (09/08 1345) Cardiac Rhythm:  [-] Sinus bradycardia;Heart block (09/08 0705) Resp:  [0-24] 20 (09/08 1345) BP: (119-185)/(53-82) 183/72 mmHg (09/08 1345) SpO2:  [93 %-97 %] 97 % (09/08 1345) Weight:  [158 lb 4.6 oz (71.8 kg)] 158 lb 4.6 oz (71.8 kg) (09/08 0123)  No results for input(s): GLUCAP in the last 168 hours.  Recent Labs Lab 07/25/15 1705 07/28/15 1858 07/28/15 1936 07/29/15 1146  NA 139 137 140 141  K 3.6 3.7 3.7 3.5  CL 104 105 105 107  CO2 26 24  --  26  GLUCOSE 105* 97 93 97  BUN 32* 29* 30* 20  CREATININE 1.58* 1.67* 1.60* 1.31*  CALCIUM 9.0 8.6*  --  8.9    Recent Labs Lab 07/25/15 1705 07/28/15 1858  AST 35 26  ALT 30 26  ALKPHOS 60 59  BILITOT 0.7 0.7  PROT 6.4* 5.7*  ALBUMIN 3.3* 3.1*    Recent Labs Lab 07/25/15 1705 07/28/15 1858 07/28/15 1936 07/29/15 1146  WBC 6.4 5.6  --  6.3  NEUTROABS 5.0 4.4  --   --   HGB 12.1* 11.8* 12.6* 12.9*  HCT 39.4 37.9* 37.0* 41.6  MCV 79.6 79.3  --  79.1  PLT 256 187  --  213    Recent Labs Lab 07/25/15 2056  TROPONINI <0.03    Recent Labs  07/28/15 1858  LABPROT 14.1  INR 1.07    Recent Labs  07/29/15 0452  COLORURINE YELLOW  LABSPEC 1.017  PHURINE 6.5  GLUCOSEU NEGATIVE  HGBUR NEGATIVE  BILIRUBINUR NEGATIVE  KETONESUR NEGATIVE  PROTEINUR NEGATIVE  UROBILINOGEN 0.2  NITRITE NEGATIVE  LEUKOCYTESUR MODERATE*       Component Value Date/Time   CHOL 159 07/29/2015 1146   TRIG 130 07/29/2015  1146   HDL 41 07/29/2015 1146   CHOLHDL 3.9 07/29/2015 1146   VLDL 26 07/29/2015 1146   LDLCALC 92 07/29/2015 1146   No results found for: HGBA1C    Component Value Date/Time   LABOPIA NONE DETECTED 07/29/2015 0452   COCAINSCRNUR NONE DETECTED 07/29/2015 0452   LABBENZ NONE DETECTED 07/29/2015 0452   AMPHETMU NONE DETECTED 07/29/2015 0452   THCU NONE DETECTED 07/29/2015 0452   LABBARB NONE DETECTED 07/29/2015 0452     Recent Labs Lab 07/28/15 [place] <5    I have personally reviewed the radiological images below and agree with the radiology interpretations.  Dg Chest 2 View  07/25/2015    IMPRESSION: 1. No acute thoracic findings. 2. Mild to moderate enlargement of the cardiopericardial silhouette, without edema. Atherosclerotic aortic arch. 3. Thoracic spondylosis.      Ct Head Wo Contrast  07/28/2015    IMPRESSION: Regional low attenuation within the left frontal parietal lobe may represent age-indeterminate, potentially subacute or infarct. Chronic left alignment lacunar infarct.    Mri and Mra Head Wo Contrast  07/29/2015  IMPRESSION: 1. Small acute nonhemorrhagic infarcts in the left paramedian midbrain, left thalamus, and left occipital  white matter. 2. As permitted by motion, no acute arterial finding. The bilateral posterior cerebral arteries are fetal type. 3. Diffuse intracranial atherosclerosis without treatable flow limiting stenosis.     Carotid Doppler  Bilateral: 1-39% ICA stenosis. Vertebral artery flow is antegrade.  2D Echocardiogram  - Left ventricle: The cavity size was normal. Wall thickness was increased in a pattern of moderate LVH. Systolic function was normal. The estimated ejection fraction was in the range of 50% to 55%. Wall motion was normal; there were no regional wall motion abnormalities. Doppler parameters are consistent with abnormal left ventricular relaxation (grade 1 diastolic dysfunction). - Aortic valve: There was mild  regurgitation. - Mitral valve: Calcified annulus. - Right ventricle: The cavity size was mildly dilated. - Right atrium: The atrium was mildly dilated. Impressions: - No cardiac source of emboli was indentified.  EKG  sinus bradycardia. For complete results please see formal report.  EEG pending  PHYSICAL EXAM  Temp:  [97.8 F (36.6 C)-98.7 F (37.1 C)] 98 F (36.7 C) (09/08 1345) Pulse Rate:  [42-47] 46 (09/08 1345) Resp:  [0-24] 20 (09/08 1345) BP: (119-185)/(53-82) 183/72 mmHg (09/08 1345) SpO2:  [93 %-97 %] 97 % (09/08 1345) Weight:  [158 lb 4.6 oz (71.8 kg)] 158 lb 4.6 oz (71.8 kg) (09/08 0123)  General - Well nourished, well developed, sleepy and lethargic.  Ophthalmologic - fundi not visualized due to noncooperation.  Cardiovascular - Regular rhythm but bradycardia.  Mental Status -  Level of arousal and orientation to place, and person were intact, but not to time. Language including expression, naming, repetition, comprehension was assessed and found to have paucity of speech, mild dysarthria, but able to follow simple commands and intact with naming and repetition.  Cranial Nerves II - XII - II - Visual field intact OU. III, IV, VI - Extraocular movements exam showed left eye deficit on medial gaze, consistent with left INO. V - Facial sensation intact bilaterally. VII - mild left nasolabial fold flattening. VIII - Hearing & vestibular intact bilaterally. X - Palate elevates symmetrically. XI - Chin turning & shoulder shrug intact bilaterally. XII - Tongue protrusion intact.  Motor Strength - The patient's strength was 4/5 in all extremities and pronator drift was absent.  Bulk was normal and fasciculations were absent.   Motor Tone - Muscle tone was assessed at the neck and appendages and was normal.  Reflexes - The patient's reflexes were symmetrical in all extremities and he had no pathological reflexes.  Sensory - Light touch, temperature/pinprick were  assessed and were symmetrical.    Coordination - The patient had normal movements in the hands with no ataxia or dysmetria.  Tremor was absent.  Gait and Station - deferred due to lethargy.   ASSESSMENT/PLAN Mr. Niki Cosman is a 81 y.o. male with history of HTN, stroke in 2010, "irregular heart beat" on amiodarone and s/p suprapubic catheter with recent UTI admitted for AMS, dizziness, right facial droop, right side weakness, and nonverbal. Symptoms much improved.    Stroke:  left paramedian, left thalamus, left occipoparital infarcts, embolic secondary to unclear source but could be due to "irregular heart beat" on amiodarone, question for afib  MRI  Acute left paramedian, left thalamus and left occipitoparietal infarcts. Pt also has remote cortical and subcortial infarcts bilaterally, also concerning for embolic source.  MRA  Diffuse intracranial athero  Carotid Doppler  unremarkable  2D Echo  EF 50-55%, unremarkable  EEG pending  LDL 92  HgbA1c pending  lovenox  for VTE prophylaxis  Diet NPO time specified   aspirin 81 mg orally every day prior to admission, now on aspirin 325 mg orally every day. Due to embolic pattern acute and remote infarcts, hx of irregular heart beat on amiodarone, suspicious for afib. Please contact his cardiologist in [place], if Afib confirmed, we recommend eliquis for anticoagulation.   Patient counseled to be compliant with his antithrombotic medications  Ongoing aggressive stroke risk factor management  Therapy recommendations:  pending  Disposition:  Pending  ? afib  Hx of irregular heart beat on amiodarone  Please getting medical records from his cardiologist in [place]  If [place] confirmed, we recommend eliquis for anticoagulation.   Son confirmed that pt is high functioning at home and no frequent falls  Pt had brief rectal bleeding weeks ago, contributed to [employer]. But son insist that it was brief and does not want to hold off  eliquis if needed.   Condition Points   H HTN: uncontrolled, >099 mmHg systolic 1   A2 Abnormal renal function: Dialysis, transplant, Cr >2.6 mg/dL or >200 mol/L Abnormal liver function: Cirrhosis or Bilirubin >2x Normal or AST/ALT/AP >3x Normal 0 0   S Prior history of stroke  1   B Bleeding: prior major bleeding or predisposition to bleeding 0  L Labile INR: unstable / high INRs, time in therapeutic range < 60% 0  E Elderly: age > 65 years Medication Usage Predisposing to Bleeding: (Antiplatelet agents, NSAIDs) 1 0  D  Prior alcohol or drug usage history 0    HAS-BLED score n Bleeds, n Bleeds/100 patients  0 798 9 1.13  1 1286 13 1.02  2 744 14 1.88  3 187 7 3.74  4 46 4 8.70  5 8 1  12.50    Hypertension  Home meds:   Ziac Permissive hypertension (OK if <220/120) for 24-48 hours post stroke and then gradually normalized within 5-7 days. Currently on none  Unstable, BP on the high side  Patient counseled to be compliant with his blood pressure medications  Hyperlipidemia  Home meds:  Fish oil, and pravastatin   Currently on pravastatin  LDL 92, goal < 70  Continue statin at discharge  Other Stroke Risk Factors  Advanced age  Hx stroke/TIA  Recent infection  Other Active Problems  UTI  Suprapubic catheter  Other Pertinent History  AKI  Hospital day # 1   Rosalin Hawking, MD PhD Stroke Neurology 07/29/2015 2:49 PM    To contact Stroke Continuity provider, please refer to [email or web address] After hours, contact General Neurology

## 2015-07-29 NOTE — Progress Notes (Signed)
DR paged to notify of current blood pressure. 07/29/2015 2:48 PM Umair Rosiles

## 2015-07-29 NOTE — Progress Notes (Signed)
VASCULAR LAB PRELIMINARY  PRELIMINARY  PRELIMINARY  PRELIMINARY  Carotid duplex completed.    Preliminary report:  1-39% plaquing.  Vertebral artery flow is antegrade.   Hannelore Bova, RVT 07/29/2015, 10:05 AM

## 2015-07-30 ENCOUNTER — Inpatient Hospital Stay (HOSPITAL_COMMUNITY): Payer: PPO

## 2015-07-30 DIAGNOSIS — I480 Paroxysmal atrial fibrillation: Secondary | ICD-10-CM | POA: Insufficient documentation

## 2015-07-30 DIAGNOSIS — I6340 Cerebral infarction due to embolism of unspecified cerebral artery: Secondary | ICD-10-CM | POA: Diagnosis not present

## 2015-07-30 DIAGNOSIS — F05 Delirium due to known physiological condition: Secondary | ICD-10-CM

## 2015-07-30 DIAGNOSIS — I639 Cerebral infarction, unspecified: Secondary | ICD-10-CM | POA: Insufficient documentation

## 2015-07-30 DIAGNOSIS — E785 Hyperlipidemia, unspecified: Secondary | ICD-10-CM | POA: Insufficient documentation

## 2015-07-30 LAB — HEMOGLOBIN A1C
Hgb A1c MFr Bld: 6.2 % — ABNORMAL HIGH (ref 4.8–5.6)
Mean Plasma Glucose: 131 mg/dL

## 2015-07-30 LAB — BASIC METABOLIC PANEL
Anion gap: 8 (ref 5–15)
BUN: 18 mg/dL (ref 6–20)
CALCIUM: 8.8 mg/dL — AB (ref 8.9–10.3)
CO2: 24 mmol/L (ref 22–32)
CREATININE: 1.22 mg/dL (ref 0.61–1.24)
Chloride: 108 mmol/L (ref 101–111)
GFR calc Af Amer: 56 mL/min — ABNORMAL LOW (ref >60)
GFR, EST NON AFRICAN AMERICAN: 49 mL/min — AB (ref >60)
GLUCOSE: 97 mg/dL (ref 65–99)
POTASSIUM: 3.5 mmol/L (ref 3.5–5.1)
SODIUM: 140 mmol/L (ref 135–145)

## 2015-07-30 LAB — CBC
HCT: 40 % (ref 39.0–52.0)
Hemoglobin: 12.4 g/dL — ABNORMAL LOW (ref 13.0–17.0)
MCH: 24.3 pg — AB (ref 26.0–34.0)
MCHC: 31 g/dL (ref 30.0–36.0)
MCV: 78.3 fL (ref 78.0–100.0)
PLATELETS: 200 10*3/uL (ref 150–400)
RBC: 5.11 MIL/uL (ref 4.22–5.81)
RDW: 17.2 % — AB (ref 11.5–15.5)
WBC: 5.7 10*3/uL (ref 4.0–10.5)

## 2015-07-30 MED ORDER — ASPIRIN 81 MG PO CHEW
81.0000 mg | CHEWABLE_TABLET | Freq: Once | ORAL | Status: AC
  Administered 2015-07-31: 81 mg via ORAL

## 2015-07-30 MED ORDER — APIXABAN 5 MG PO TABS
5.0000 mg | ORAL_TABLET | Freq: Two times a day (BID) | ORAL | Status: DC
  Administered 2015-07-30 – 2015-07-31 (×2): 5 mg via ORAL
  Filled 2015-07-30 (×2): qty 1

## 2015-07-30 MED ORDER — BISOPROLOL-HYDROCHLOROTHIAZIDE 10-6.25 MG PO TABS
1.0000 | ORAL_TABLET | Freq: Every day | ORAL | Status: DC
  Administered 2015-07-30 – 2015-07-31 (×2): 1 via ORAL
  Filled 2015-07-30 (×2): qty 1

## 2015-07-30 NOTE — Consult Note (Signed)
ANTICOAGULATION CONSULT NOTE - Initial Consult  Pharmacy Consult for apixaban Indication: atrial fibrillation  No Known Allergies  Patient Measurements: Height: 6\' 1"  (185.4 cm) Weight: 158 lb 4.6 oz (71.8 kg) IBW/kg (Calculated) : 79.9  Vital Signs: Temp: 98.7 F (37.1 C) (09/09 1415) Temp Source: Oral (09/09 0524) BP: 144/67 mmHg (09/09 1415) Pulse Rate: 52 (09/09 1415)  Labs:  Recent Labs  07/28/15 1858 07/28/15 1936 07/29/15 1146 07/30/15 0700  HGB 11.8* 12.6* 12.9* 12.4*  HCT 37.9* 37.0* 41.6 40.0  PLT 187  --  213 200  APTT 22*  --   --   --   LABPROT 14.1  --   --   --   INR 1.07  --   --   --   CREATININE 1.67* 1.60* 1.31* 1.22    Estimated Creatinine Clearance: 36.8 mL/min (by C-G formula based on Cr of 1.22).   Medical History: Past Medical History  Diagnosis Date  . Hypertension   . Prostate enlargement   . Stroke     no residual weakness   Assessment: 81 yo male presenting with a stroke  PMH: Htn and AFIB?  AC: none PTA  Neuro: Multiple infarcts seen. Potentially secondary to embolic nature of afib.   Renal: SCr 1.22  Heme: H&H 12.4/40, Plt 200  Plan: Discontinue enoxaparin Apixaban 5 mg BID Monitor for s/sx of bleeding  Pharmacy will sign off but are available as needed  Levester Fresh, PharmD, BCPS Clinical Pharmacist Pager [phone] 07/30/2015 2:59 PM

## 2015-07-30 NOTE — Care Management Note (Signed)
Case Management Note  Patient Details  Name: Charles [name] MRN: [id number] Date of Birth: [birth year]  Subjective/Objective:      CIR consult is rec SNF, CSW aware.  NCM will cont to follow for dc needs.              Action/Plan:   Expected Discharge Date:                  Expected Discharge Plan:  Skilled Nursing Facility  In-House Referral:  Clinical Social Work  Discharge planning Services  CM Consult  Post Acute Care Choice:    Choice offered to:     DME Arranged:    DME Agency:     HH Arranged:    [place] Agency:     Status of Service:  Completed, signed off  Medicare Important Message Given:  Yes-second notification given Date Medicare IM Given:    Medicare IM give by:    Date Additional Medicare IM Given:    Additional Medicare Important Message give by:     If discussed at [place] of Stay Meetings, dates discussed:    Additional Comments:  Zenon Mayo, RN 07/30/2015, 3:50 PM

## 2015-07-30 NOTE — Care Management Important Message (Signed)
Important Message  Patient Details  Name: Charles [name] MRN: [id number] Date of Birth: 1918/08/15   Medicare Important Message Given:  Yes-second notification given    Nathen May 07/30/2015, 11:59 AMImportant Message  Patient Details  Name: Charles [name] MRN: [id number] Date of Birth: 07-07-1918   Medicare Important Message Given:  Yes-second notification given    Nathen May 07/30/2015, 11:59 AM

## 2015-07-30 NOTE — Clinical Social Work Placement (Signed)
   CLINICAL SOCIAL WORK PLACEMENT  NOTE  Date:  07/30/2015  Patient Details  Name: [name] MRN: [id number] Date of Birth: 1918-10-08  Clinical Social Work is seeking post-discharge placement for this patient at the [place] level of care (*CSW will initial, date and re-position this form in  chart as items are completed):  Yes   Patient/family provided with [place] Work Department's list of facilities offering this level of care within the geographic area requested by the patient (or if unable, by the patient's family).  Yes   Patient/family informed of their freedom to choose among providers that offer the needed level of care, that participate in Medicare, Medicaid or managed care program needed by the patient, have an available bed and are willing to accept the patient.  Yes   Patient/family informed of [facility]'s ownership interest in [facility] and [facility], as well as of the fact that they are under no obligation to receive care at these facilities.  PASRR submitted to EDS on 07/30/15     PASRR number received on 07/30/15     Existing PASRR number confirmed on       FL2 transmitted to all facilities in geographic area requested by pt/family on 07/30/15     FL2 transmitted to all facilities within larger geographic area on       Patient informed that his/her managed care company has contracts with or will negotiate with certain facilities, including the following:            Patient/family informed of bed offers received.  Patient chooses bed at       Physician recommends and patient chooses bed at      Patient to be transferred to   on  .  Patient to be transferred to facility by       Patient family notified on   of transfer.  Name of family member notified:        PHYSICIAN       Additional Comment:    _______________________________________________ Liz Beach MSW, [place], [place], [phone]

## 2015-07-30 NOTE — Progress Notes (Signed)
Patient seen and examined. Case d/w residents in detail. I agree with findings and plan as documented in Dr. Alcario Drought note.  Patient was admitted with an acute CVA possibly secondary to underlying afib given embolic nature of CVA and hx of afib (per cardiologist in [place]). EEG, ECHO and carotid doppler results noted. Case d/w patient and son at bedside in detail. Explained risks and benefits of starting blood thinners and they are in agreement with starting eliquis for now. Will decrease asa to 81mg  while on eliquis. No further w/u for now/ Patient is stable for d/c to SNF once bed available

## 2015-07-30 NOTE — Progress Notes (Signed)
Physical Therapy Treatment Patient Details Name: Charles [name] MRN: [id number] DOB: 12/07/1917 Today's Date: 07/30/2015    History of Present Illness Pt adm with rt sided weakness and slurred speech. MRI showed small acute nonhemorrhagic infarcts in the left paramedian midbrain, left thalamus, and left occipital white matter. PMH - HTN, CVA    PT Comments    Pt climbing out of chair when entering room. Sitting on extended legrest of recliner. CIR denied. Recommend ST- SNF. Good progress with mobility.  Follow Up Recommendations  SNF     Equipment Recommendations  None recommended by PT    Recommendations for Other Services Rehab consult     Precautions / Restrictions Precautions Precautions: Fall Restrictions Weight Bearing Restrictions: No    Mobility  Bed Mobility                  Transfers Overall transfer level: Needs assistance Equipment used: Rolling walker (2 wheeled) Transfers: Sit to/from Stand Sit to Stand: Min assist         General transfer comment: Assist for balance and to raise hips.  Ambulation/Gait Ambulation/Gait assistance: Min assist Ambulation Distance (Feet): 150 Feet Assistive device: Rolling walker (2 wheeled) Gait Pattern/deviations: Step-through pattern;Decreased stride length;Drifts right/left     General Gait Details: Verbal cues to stand more erect and stay closer to walker. Pt with slight rt lateral lean.   Stairs            Wheelchair Mobility    Modified Rankin (Stroke Patients Only)       Balance Overall balance assessment: Needs assistance Sitting-balance support: Single extremity supported;Feet supported Sitting balance-Leahy Scale: Poor Sitting balance - Comments: Upper extremity support sitting edge of chair.   Standing balance support: Bilateral upper extremity supported Standing balance-Leahy Scale: Poor Standing balance comment: support of walker and min guard assistance                    Cognition Arousal/Alertness: Awake/alert Behavior During Therapy: WFL for tasks assessed/performed Overall Cognitive Status: No family/caregiver present to determine baseline cognitive functioning Area of Impairment: Safety/judgement;Problem solving;Memory     Memory: Decreased recall of precautions;Decreased short-term memory   Safety/Judgement: Decreased awareness of safety;Decreased awareness of deficits   Problem Solving: Requires verbal cues;Requires tactile cues General Comments: On entering pt sitting on extended legrest of recliner trying to get up.    Exercises      General Comments        Pertinent Vitals/Pain Pain Assessment: No/denies pain    Home Living                      Prior Function            PT Goals (current goals can now be found in the care plan section) Acute Rehab PT Goals Patient Stated Goal: Not stated PT Goal Formulation: With patient Time For Goal Achievement: 08/13/15 Potential to Achieve Goals: Good Progress towards PT goals: Goals met and updated - see care plan    Frequency  Min 3X/week    PT Plan Discharge plan needs to be updated;Frequency needs to be updated    Co-evaluation             End of Session Equipment Utilized During Treatment: Gait belt Activity Tolerance: Patient tolerated treatment well Patient left: with call bell/phone within reach;in chair;with chair alarm set     Time: [phone] PT Time Calculation (min) (ACUTE ONLY): 18 min  Charges:  $Gait  Training: 8-22 mins                    G Codes:      Signa Cheek 08/05/15, 10:47 AM  Suanne Marker PT [phone]

## 2015-07-30 NOTE — Progress Notes (Signed)
STROKE TEAM PROGRESS NOTE   SUBJECTIVE (INTERVAL HISTORY) No family is at bedside. He is much better mentally and sitting in chair. No complains. He is waiting for EEG.   OBJECTIVE Temp:  [97.4 F (36.3 C)-98.7 F (37.1 C)] 98.7 F (37.1 C) (09/09 1415) Pulse Rate:  [48-52] 52 (09/09 1415) Cardiac Rhythm:  [-] Sinus bradycardia (09/09 0840) Resp:  [16-20] 16 (09/09 1415) BP: (144-187)/(67-86) 144/67 mmHg (09/09 1415) SpO2:  [93 %-97 %] 95 % (09/09 1415)  No results for input(s): GLUCAP in the last 168 hours.  Recent Labs Lab 07/25/15 1705 07/28/15 1858 07/28/15 1936 07/29/15 1146 07/30/15 0700  NA 139 137 140 141 140  K 3.6 3.7 3.7 3.5 3.5  CL 104 105 105 107 108  CO2 26 24  --  26 24  GLUCOSE 105* 97 93 97 97  BUN 32* 29* 30* 20 18  CREATININE 1.58* 1.67* 1.60* 1.31* 1.22  CALCIUM 9.0 8.6*  --  8.9 8.8*    Recent Labs Lab 07/25/15 1705 07/28/15 1858  AST 35 26  ALT 30 26  ALKPHOS 60 59  BILITOT 0.7 0.7  PROT 6.4* 5.7*  ALBUMIN 3.3* 3.1*    Recent Labs Lab 07/25/15 1705 07/28/15 1858 07/28/15 1936 07/29/15 1146 07/30/15 0700  WBC 6.4 5.6  --  6.3 5.7  NEUTROABS 5.0 4.4  --   --   --   HGB 12.1* 11.8* 12.6* 12.9* 12.4*  HCT 39.4 37.9* 37.0* 41.6 40.0  MCV 79.6 79.3  --  79.1 78.3  PLT 256 187  --  213 200    Recent Labs Lab 07/25/15 2056  TROPONINI <0.03    Recent Labs  07/28/15 1858  LABPROT 14.1  INR 1.07    Recent Labs  07/29/15 0452  COLORURINE YELLOW  LABSPEC 1.017  PHURINE 6.5  GLUCOSEU NEGATIVE  HGBUR NEGATIVE  BILIRUBINUR NEGATIVE  KETONESUR NEGATIVE  PROTEINUR NEGATIVE  UROBILINOGEN 0.2  NITRITE NEGATIVE  LEUKOCYTESUR MODERATE*       Component Value Date/Time   CHOL 159 07/29/2015 1146   TRIG 130 07/29/2015 1146   HDL 41 07/29/2015 1146   CHOLHDL 3.9 07/29/2015 1146   VLDL 26 07/29/2015 1146   LDLCALC 92 07/29/2015 1146   No results found for: HGBA1C    Component Value Date/Time   LABOPIA NONE DETECTED  07/29/2015 0452   COCAINSCRNUR NONE DETECTED 07/29/2015 0452   LABBENZ NONE DETECTED 07/29/2015 0452   AMPHETMU NONE DETECTED 07/29/2015 0452   THCU NONE DETECTED 07/29/2015 0452   LABBARB NONE DETECTED 07/29/2015 0452     Recent Labs Lab 07/28/15 [place] <5    I have personally reviewed the radiological images below and agree with the radiology interpretations.  Dg Chest 2 View  07/25/2015    IMPRESSION: 1. No acute thoracic findings. 2. Mild to moderate enlargement of the cardiopericardial silhouette, without edema. Atherosclerotic aortic arch. 3. Thoracic spondylosis.      Ct Head Wo Contrast  07/28/2015    IMPRESSION: Regional low attenuation within the left frontal parietal lobe may represent age-indeterminate, potentially subacute or infarct. Chronic left alignment lacunar infarct.    Mri and Mra Head Wo Contrast  07/29/2015  IMPRESSION: 1. Small acute nonhemorrhagic infarcts in the left paramedian midbrain, left thalamus, and left occipital white matter. 2. As permitted by motion, no acute arterial finding. The bilateral posterior cerebral arteries are fetal type. 3. Diffuse intracranial atherosclerosis without treatable flow limiting stenosis.     Carotid Doppler  Bilateral: 1-39% ICA stenosis. Vertebral artery flow is antegrade.  2D Echocardiogram  - Left ventricle: The cavity size was normal. Wall thickness was increased in a pattern of moderate LVH. Systolic function was normal. The estimated ejection fraction was in the range of 50% to 55%. Wall motion was normal; there were no regional wall motion abnormalities. Doppler parameters are consistent with abnormal left ventricular relaxation (grade 1 diastolic dysfunction). - Aortic valve: There was mild regurgitation. - Mitral valve: Calcified annulus. - Right ventricle: The cavity size was mildly dilated. - Right atrium: The atrium was mildly dilated. Impressions: - No cardiac source of emboli was  indentified.  EKG  sinus bradycardia. For complete results please see formal report.  EEG - Impression: This awake and asleep EEG is abnormal due to occasional focal slowing over the left temporal region. Clinical Correlation of the above findings indicates focal cerebral dysfunction over the left temporal region suggestive of underlying structural or physiologic abnormality. The absence of epileptiform discharges does not exclude a clinical diagnosis of epilepsy. Clinical correlation is advised  PHYSICAL EXAM  Temp:  [97.4 F (36.3 C)-98.7 F (37.1 C)] 98.7 F (37.1 C) (09/09 1415) Pulse Rate:  [48-52] 52 (09/09 1415) Resp:  [16-20] 16 (09/09 1415) BP: (144-187)/(67-86) 144/67 mmHg (09/09 1415) SpO2:  [93 %-97 %] 95 % (09/09 1415)  General - Well nourished, well developed, sleepy and lethargic.  Ophthalmologic - fundi not visualized due to noncooperation.  Cardiovascular - Regular rhythm but bradycardia.  Mental Status -  Level of arousal and orientation to place, and person were intact, but not to time. Language including expression, naming, repetition, comprehension was assessed and found to have paucity of speech, mild dysarthria, but able to follow simple commands and intact with naming and repetition.  Cranial Nerves II - XII - II - Visual field intact OU. III, IV, VI - Extraocular movements exam showed left eye deficit on medial gaze, consistent with left INO. V - Facial sensation intact bilaterally. VII - mild left nasolabial fold flattening. VIII - Hearing & vestibular intact bilaterally. X - Palate elevates symmetrically. XI - Chin turning & shoulder shrug intact bilaterally. XII - Tongue protrusion intact.  Motor Strength - The patient's strength was 4/5 in all extremities and pronator drift was absent.  Bulk was normal and fasciculations were absent.   Motor Tone - Muscle tone was assessed at the neck and appendages and was normal.  Reflexes - The patient's  reflexes were symmetrical in all extremities and he had no pathological reflexes.  Sensory - Light touch, temperature/pinprick were assessed and were symmetrical.    Coordination - The patient had normal movements in the hands with no ataxia or dysmetria.  Tremor was absent.  Gait and Station - deferred due to lethargy.   ASSESSMENT/PLAN [name] is a 81 y.o. male with history of HTN, stroke in 2010, afib on amiodarone and s/p suprapubic catheter with recent UTI admitted for AMS, dizziness, right facial droop, right side weakness, and nonverbal. Symptoms much improved.    Stroke:  left paramedian, left thalamus, left occipoparital infarcts, embolic secondary to afib  MRI  Acute left paramedian, left thalamus and left occipitoparietal infarcts. Pt also has remote cortical and subcortial infarcts bilaterally, also concerning for embolic source.  MRA  Diffuse intracranial athero  Carotid Doppler  unremarkable  2D Echo  EF 50-55%, unremarkable  EEG occasional left temporal focal slowing, no seizure activity  LDL 92  HgbA1c 6.2  lovenox for VTE prophylaxis  Diet Heart Room service appropriate?: Yes; Fluid consistency:: Thin   aspirin 81 mg orally every day prior to admission, now on aspirin 325 mg orally every day. Due to confirmation of afib, we recommend eliquis for anticoagulation.   Patient counseled to be compliant with his antithrombotic medications  Ongoing aggressive stroke risk factor management  Therapy recommendations:  pending  Disposition:  Pending  Afib - confirmed with pt's cardiologist in [place]  Hx of afib on amiodarone  We recommend eliquis for anticoagulation.   Son confirmed that pt is high functioning at home and no frequent falls  Pt had brief rectal bleeding weeks ago, contributed to [employer]. But son insist that it was brief and does not want to hold off eliquis   Condition Points   H HTN: uncontrolled, >841 mmHg systolic 1   A2 Abnormal renal  function: Dialysis, transplant, Cr >2.6 mg/dL or >200 mol/L Abnormal liver function: Cirrhosis or Bilirubin >2x Normal or AST/ALT/AP >3x Normal 0 0   S Prior history of stroke  1   B Bleeding: prior major bleeding or predisposition to bleeding 0  L Labile INR: unstable / high INRs, time in therapeutic range < 60% 0  E Elderly: age > 65 years Medication Usage Predisposing to Bleeding: (Antiplatelet agents, NSAIDs) 1 0  D  Prior alcohol or drug usage history 0    HAS-BLED score n Bleeds, n Bleeds/100 patients  0 798 9 1.13  1 1286 13 1.02  2 744 14 1.88  3 187 7 3.74  4 46 4 8.70  5 8 1  12.50    Hypertension  Home meds:   Ziac Permissive hypertension (OK if <220/120) for 24-48 hours post stroke and then gradually normalized within 5-7 days. Currently on none  Unstable, BP on the high side  Patient counseled to be compliant with his blood pressure medications  Hyperlipidemia  Home meds:  Fish oil, and pravastatin   Currently on pravastatin  LDL 92, goal < 70  Continue statin at discharge  Other Stroke Risk Factors  Advanced age  Hx stroke/TIA  Recent infection  Other Active Problems  UTI - Day # 2 Keflex  Suprapubic catheter  Other Pertinent History  AKI  Hospital day # 2  Neurology will sign off. Please call with questions. Pt will follow up with Dr. Erlinda Hong at [facility] in about 2 months. Thanks for the consult.  Rosalin Hawking, MD PhD Stroke Neurology 07/30/2015 10:23 PM   To contact Stroke Continuity provider, please refer to [email or web address] After hours, contact General Neurology

## 2015-07-30 NOTE — Progress Notes (Signed)
Subjective: Patient seen after returning from EEG with son in the room.  No new complaints right now.  Complaining of some generalized fatigue but overall seems improved from yesterday.  Objective: Vital signs in last 24 hours: Filed Vitals:   07/29/15 2127 07/29/15 2159 07/30/15 0524 07/30/15 1415  BP: 186/86 181/80 187/76 144/67  Pulse: 49 50 52 52  Temp: 97.4 F (36.3 C) 98 F (36.7 C) 98.2 F (36.8 C) 98.7 F (37.1 C)  TempSrc: Axillary  Oral   Resp: 20 16 20 16   Height:      Weight:      SpO2: 97% 94% 93% 95%   Weight change:   Intake/Output Summary (Last 24 hours) at 07/30/15 1547 Last data filed at 07/30/15 1413  Gross per 24 hour  Intake    170 ml  Output   1225 ml  Net  -1055 ml   General: resting in bed, no distress HEENT: no scleral icterus Cardiac: bradycardic, regular rhythm Pulm: clear to auscultation bilaterally Abd: soft, nontender, nondistended, BS present Ext: warm and well perfused, no pedal edema GU: suprapubic catheter in place Neuro: alert and oriented.  Knows that he is in the hospital in [place] and what the year and month is. Strength 5/5 lower extremities, 5/5 upper extremities  Lab Results: Basic Metabolic Panel:  Recent Labs Lab 07/29/15 1146 07/30/15 0700  NA 141 140  K 3.5 3.5  CL 107 108  CO2 26 24  GLUCOSE 97 97  BUN 20 18  CREATININE 1.31* 1.22  CALCIUM 8.9 8.8*   Liver Function Tests:  Recent Labs Lab 07/25/15 1705 07/28/15 1858  AST 35 26  ALT 30 26  ALKPHOS 60 59  BILITOT 0.7 0.7  PROT 6.4* 5.7*  ALBUMIN 3.3* 3.1*   CBC:  Recent Labs Lab 07/25/15 1705 07/28/15 1858  07/29/15 1146 07/30/15 0700  WBC 6.4 5.6  --  6.3 5.7  NEUTROABS 5.0 4.4  --   --   --   HGB 12.1* 11.8*  < > 12.9* 12.4*  HCT 39.4 37.9*  < > 41.6 40.0  MCV 79.6 79.3  --  79.1 78.3  PLT 256 187  --  213 200  < > = values in this interval not displayed. Cardiac Enzymes:  Recent Labs Lab 07/25/15 2056  TROPONINI <0.03    Fasting Lipid Panel:  Recent Labs Lab 07/29/15 1146  CHOL 159  HDL 41  LDLCALC 92  TRIG 130  CHOLHDL 3.9   Coagulation:  Recent Labs Lab 07/28/15 1858  LABPROT 14.1  INR 1.07   Anemia Panel: No results for input(s): VITAMINB12, FOLATE, FERRITIN, TIBC, IRON, RETICCTPCT in the last 168 hours. Urine Drug Screen: Drugs of Abuse     Component Value Date/Time   LABOPIA NONE DETECTED 07/29/2015 0452   COCAINSCRNUR NONE DETECTED 07/29/2015 0452   LABBENZ NONE DETECTED 07/29/2015 0452   AMPHETMU NONE DETECTED 07/29/2015 0452   THCU NONE DETECTED 07/29/2015 0452   LABBARB NONE DETECTED 07/29/2015 0452    Alcohol Level:  Recent Labs Lab 07/28/15 1858  ETH <5   Urinalysis:  Recent Labs Lab 07/25/15 1658 07/29/15 0452  COLORURINE YELLOW YELLOW  LABSPEC 1.016 1.017  PHURINE 5.5 6.5  GLUCOSEU NEGATIVE NEGATIVE  HGBUR MODERATE* NEGATIVE  BILIRUBINUR NEGATIVE NEGATIVE  KETONESUR NEGATIVE NEGATIVE  PROTEINUR 30* NEGATIVE  UROBILINOGEN 0.2 0.2  NITRITE NEGATIVE NEGATIVE  LEUKOCYTESUR MODERATE* MODERATE*     Micro Results: Recent Results (from the past 240 hour(s))  Urine  culture     Status: None   Collection Time: 07/25/15  5:26 PM  Result Value Ref Range Status   Specimen Description URINE, RANDOM  Final   Special Requests NONE  Final   Culture NO GROWTH 1 DAY  Final   Report Status 07/26/2015 FINAL  Final   Studies/Results: Ct Head Wo Contrast  07/28/2015   CLINICAL DATA:  Code stroke. Small down after waking up from that. Right-sided deficits.  EXAM: CT HEAD WITHOUT CONTRAST  TECHNIQUE: Contiguous axial images were obtained from the base of the skull through the vertex without intravenous contrast.  COMPARISON:  None.  FINDINGS: Ventricles and sulci are prominent compatible with atrophy. Focal hypodensity within the left thalamus, most compatible with remote infarct. Left frontal parietal lobe low-attenuation (image 22; series 21). No evidence for acute  intracranial hemorrhage or significant mass effect. Paranasal sinuses are well aerated. Mastoid air cells unremarkable. Calvarium is intact. Prominent arachnoid granulations within the occipital bone.  IMPRESSION: Regional low attenuation within the left frontal parietal lobe may represent age-indeterminate, potentially subacute or infarct. Chronic left alignment lacunar infarct.  Critical Value/emergent results were called by telephone at the time of interpretation on 07/28/2015 at 7:11 pm to Dr. Doy Mince, who verbally acknowledged these results.   Electronically Signed   By: Lovey Newcomer M.D.   On: 07/28/2015 19:19   Mr Jodene Nam Head Wo Contrast  07/29/2015   CLINICAL DATA:  4 hours of right-sided weakness with slurred speech.  EXAM: MRI HEAD WITHOUT CONTRAST  MRA HEAD WITHOUT CONTRAST  TECHNIQUE: Multiplanar, multiecho pulse sequences of the brain and surrounding structures were obtained without intravenous contrast. Angiographic images of the head were obtained using MRA technique without contrast.  COMPARISON:  None.  FINDINGS: MRI HEAD FINDINGS  Motion degraded study.  Calvarium and upper cervical spine: No focal marrow signal abnormality.  Orbits: Bilateral cataract resection.  No acute finding.  Sinuses and Mastoids: Clear. Mastoid and middle ears are clear.  Brain: There are sub cm areas of restricted diffusion in the left paramedian midbrain, including periaqueductal gray matter and along the interpeduncular fossa, lateral left thalamus, and left occipital subcortical white matter.  Remote cortical and subcortical infarct involving the posterior left frontal lobe, central MCA territory. Remote lacunar infarct in the left thalamus and periatrial white matter on the right.  Probable hygromas in the posterior fossa, without mass effect.  MRA HEAD FINDINGS  Motion degraded study.  Symmetric carotid arteries. Bilateral fetal type PCAs. Symmetric vertebral arteries. Dominant left AICA and right PICA.  There is diffuse  medium vessel irregularity and narrowing consistent with atherosclerosis, moderate to advanced. When accounting for motion artifact at slab interface, proximal basilar artery narrowing is likely artifactual. There is mild narrowing of the distal basilar which is likely atherosclerotic. No evidence of intramural hematoma on T1 weighted imaging, as permitted by motion artifact. At the expected location of the left P1 -P2 junction there is a moderate focal stenosis, of note given the pattern of infarctions.  No evidence of aneurysm.  IMPRESSION: 1. Small acute nonhemorrhagic infarcts in the left paramedian midbrain, left thalamus, and left occipital white matter. 2. As permitted by motion, no acute arterial finding. The bilateral posterior cerebral arteries are fetal type. 3. Diffuse intracranial atherosclerosis without treatable flow limiting stenosis.   Electronically Signed   By: Monte Fantasia M.D.   On: 07/29/2015 10:11   Mr Brain Wo Contrast  07/29/2015   CLINICAL DATA:  4 hours of right-sided weakness with  slurred speech.  EXAM: MRI HEAD WITHOUT CONTRAST  MRA HEAD WITHOUT CONTRAST  TECHNIQUE: Multiplanar, multiecho pulse sequences of the brain and surrounding structures were obtained without intravenous contrast. Angiographic images of the head were obtained using MRA technique without contrast.  COMPARISON:  None.  FINDINGS: MRI HEAD FINDINGS  Motion degraded study.  Calvarium and upper cervical spine: No focal marrow signal abnormality.  Orbits: Bilateral cataract resection.  No acute finding.  Sinuses and Mastoids: Clear. Mastoid and middle ears are clear.  Brain: There are sub cm areas of restricted diffusion in the left paramedian midbrain, including periaqueductal gray matter and along the interpeduncular fossa, lateral left thalamus, and left occipital subcortical white matter.  Remote cortical and subcortical infarct involving the posterior left frontal lobe, central MCA territory. Remote lacunar  infarct in the left thalamus and periatrial white matter on the right.  Probable hygromas in the posterior fossa, without mass effect.  MRA HEAD FINDINGS  Motion degraded study.  Symmetric carotid arteries. Bilateral fetal type PCAs. Symmetric vertebral arteries. Dominant left AICA and right PICA.  There is diffuse medium vessel irregularity and narrowing consistent with atherosclerosis, moderate to advanced. When accounting for motion artifact at slab interface, proximal basilar artery narrowing is likely artifactual. There is mild narrowing of the distal basilar which is likely atherosclerotic. No evidence of intramural hematoma on T1 weighted imaging, as permitted by motion artifact. At the expected location of the left P1 -P2 junction there is a moderate focal stenosis, of note given the pattern of infarctions.  No evidence of aneurysm.  IMPRESSION: 1. Small acute nonhemorrhagic infarcts in the left paramedian midbrain, left thalamus, and left occipital white matter. 2. As permitted by motion, no acute arterial finding. The bilateral posterior cerebral arteries are fetal type. 3. Diffuse intracranial atherosclerosis without treatable flow limiting stenosis.   Electronically Signed   By: Monte Fantasia M.D.   On: 07/29/2015 10:11   Medications: Scheduled Meds: .  stroke: mapping our early stages of recovery book   Does not apply Once  . amiodarone  100 mg Oral Daily  . antiseptic oral rinse  7 mL Mouth Rinse q12n4p  . apixaban  5 mg Oral Q12H  . aspirin  325 mg Oral Daily   Or  . aspirin  300 mg Rectal Daily  . bisoprolol-hydrochlorothiazide  1 tablet Oral Daily  . cephALEXin  500 mg Oral BID  . chlorhexidine  15 mL Mouth Rinse BID  . pravastatin  20 mg Oral q1800  . sodium chloride  3 mL Intravenous Q12H   Continuous Infusions:  PRN Meds:.acetaminophen **OR** acetaminophen Assessment/Plan: Active Problems:   CVA (cerebral infarction)  [name] is a 81 y.o. male with history of  hypertension, stroke, a-fib(on amiodarone), BPH with suprapubic catheter and recent UTI on Keflex.  Stroke: left paramedian, left thalamus, left occipoparital infarcts, embolic 2/2 unclear source but could be due to atrial fibrillation -neurology following, appreciate their recommendations -follow up neuro recs -MRI: acute left paramedian, left thalamus, and left occipitoparietal infarcts.  Pt also has remote cortical and subcortical infarcts bilaterally, also concerning for embolic source -MRA: diffuse intracranial atheroslerosis -ECHO Impressions: No cardiac source of emboli was indentified. - Left ventricle: The cavity size was normal. Wall thickness was increased in a pattern of moderate LVH. Systolic function was normal. The estimated ejection fraction was in the range of 50% to 55%. Wall motion was normal; there were no regional wall motion abnormalities. Doppler parameters are consistent with abnormal left  ventricular relaxation (grade 1 diastolic dysfunction). - Aortic valve: There was mild regurgitation. - Mitral valve: Calcified annulus. - Right ventricle: The cavity size was mildly dilated. - Right atrium: The atrium was mildly dilated. -EEG Impression: This awake and asleep EEG is abnormal due to occasional focal slowing over the left temporal region. Clinical Correlation of the above findings indicates focal cerebral dysfunction over the left temporal region suggestive of underlying structural or physiologic abnormality. The absence of epileptiform discharges does not exclude a clinical diagnosis of epilepsy. Clinical correlation is advised. -LDL 92 -Hgb A1c pending -Was on aspirin 81mg  prior to admission.  Now on aspirin 325mg  PO daily.  Atrial fibrillation: history of irregular heart beat, rate controlled on amiodarone -neuro recommending Eliquis if history of a-fib confrimed.  Spoke with patients cardiologist in [place], records indicate that he has atrial  fibrillation -consult to pharmacy for Eliquis -continue amiodarone  Hypertension: on bisoprolol-HCTZ at home -Allowing for permissive HTN (goal BP <220/110 for 24-48 hours post stroke and then gradually normalize within 5-7 days) -resume Ziac 10-6.25mg   Hyperlipidemia: on pravastatin at home -LDL 92 with goal <70 -continue pravstatin  Urinary tract infection: should complete Keflex 500mg  bid on 9/11 -UA with moderate leukocytes -continue Keflex 500mg  PO bid   Dispo: Disposition is deferred at this time, awaiting improvement of current medical problems.  Anticipate discharge to SNF after speaking with Social Work - Engineer, building services.  The patient does have a current PCP Hayden Rasmussen, MD) and does not need an [facility] hospital follow-up appointment after discharge.  The patient does have transportation limitations that hinder transportation to clinic appointments.    LOS: 2 days   Jule Ser, DO 07/30/2015, 3:47 PM

## 2015-07-30 NOTE — Progress Notes (Signed)
Routine EEG completed, results pending. 

## 2015-07-30 NOTE — Progress Notes (Signed)
Dr. Danae Chen has completed his consult and is recommending SNF level care/therapies for Mr. Hartje.  I have spoken with the patient regarding this recommendation.  He is in agreement and states "I'm not walking too well right now because of my hip".  I have notified Tomi Bamberger, CM and Liz Beach , SW of the above.  I will sign off.  Please call if questions.  [facility] Admissions Coordinator Cell [phone] Office [phone]

## 2015-07-30 NOTE — Progress Notes (Signed)
OT Cancellation Note  Patient Details Name: Charles [name] MRN: [id number] DOB: Apr 17, 1918   Cancelled Treatment:    Reason Eval/Treat Not Completed: Fatigue/lethargy limiting ability to participate  Pt refused Eval, reporting fatigue with inability to keep eyes open.  Will follow up as able.  [name] 07/30/2015, 3:12 PM

## 2015-07-30 NOTE — Evaluation (Signed)
Speech Language Pathology Evaluation Patient Details Name: Charles [name] MRN: [id number] DOB: 18-Jun-1918 Today's Date: 07/30/2015 Time: 9628-3662 SLP Time Calculation (min) (ACUTE ONLY): 25 min  Problem List:  Patient Active Problem List   Diagnosis Date Noted  . CVA (cerebral infarction) 07/28/2015   Past Medical History:  Past Medical History  Diagnosis Date  . Hypertension   . Prostate enlargement   . Stroke     no residual weakness   Past Surgical History:  Past Surgical History  Procedure Laterality Date  . Suprapubic catheter insertion     HPI:  Patient admitted on 9/7 with right-sided weakness and slurred speech. MRI showed small acute nonhemorrhagic infarcts in the left paramedian midbrain, left thalamus, and left occipital white matter. PMH - HTN, CVA   Assessment / Plan / Recommendation Clinical Impression  Patient presents with mild cognitive-linguistic impairments as per this evaluation. Patient was lethargic and had some difficulty keeping his eyes open, but was able to answer SLP's questions. Patient exhibited perseveration on words/topics when answering open-ended questions and categorical generation (when naming foods, perseverated on 'codfish'). He exhibited part-word repetitions and initial phoneme repetitions at sentence level. Responsive and confrontational  naming were intact. Patient was oriented to self, stated birthday and then at first stated age as 31, but corrected self and said 89. Patient was slow to process when answering open-ended questions and did not appear to be aware of surroundings and current situation. He perseverated on wanting to go walking and stated (likely in reference to ALF: "they forbid me from walking". Patient's cognitive deficits are likely impacted also by the fact that he is currently fatigued, and may improve when he is more rested. Main concern at this time are for his safety awareness, problem solving, and ability to independently  express his wants/needs/thoughts.     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  Overall Cognitive Status: No family/caregiver present to determine baseline cognitive functioning Arousal/Alertness: Lethargic Orientation Level: Oriented to person;Disoriented to place;Disoriented to time;Disoriented to situation Attention: Sustained Sustained Attention: Impaired Sustained Attention Impairment: Verbal basic (impact from fatigue) Memory: Impaired Memory Impairment: Retrieval deficit;Decreased recall of new information Awareness: Impaired Awareness Impairment: Emergent impairment Executive Function: Reasoning Reasoning: Impaired Reasoning Impairment: Verbal basic;Verbal complex Behaviors: Perseveration Safety/Judgment: Impaired    Comprehension  Auditory Comprehension Overall Auditory Comprehension: Appears within functional limits for tasks assessed Yes/No Questions: Within Functional Limits Commands: Within Functional Limits Interfering Components: Attention;Processing speed    Expression Expression Primary Mode of Expression: Verbal Verbal Expression Overall Verbal Expression: Impaired Initiation: No impairment Level of Generative/Spontaneous Verbalization: Sentence Repetition: No impairment Naming: No impairment Pragmatics: No impairment Interfering Components: Attention Effective Techniques: Open ended questions Non-Verbal  Means of Communication: Not applicable   Oral / Motor Oral Motor/Sensory Function Overall Oral Motor/Sensory Function: Appears within functional limits for tasks assessed Motor Speech Overall Motor Speech: Appears within functional limits for tasks assessed   GO     Dannial Monarch 07/30/2015, 3:54 PM   Arbadella Kimbler T.  [place], [place], [place] 07/30/2015 3:54 PM Phone: [phone] Fax: [phone]

## 2015-07-30 NOTE — Procedures (Signed)
ELECTROENCEPHALOGRAM REPORT  Date of Study: 07/30/2015  Patient's Name: Charles [name] MRN: [id number] Date of Birth: 11-29-1917  Referring Provider: Dr. Rosalin Hawking  Clinical History: This is a 81 year old man with confusion, dizziness, right-sided weakness and aphasia.   Medications: acetaminophen (TYLENOL) tablet 650 mg amiodarone (PACERONE) tablet 100 mg aspirin tablet 325 mg cephALEXin (KEFLEX) capsule 500 mg enoxaparin (LOVENOX) injection 30 mg pravastatin (PRAVACHOL) tablet 20 mg  Technical Summary: A multichannel digital EEG recording measured by the international 10-20 system with electrodes applied with paste and impedances below 5000 ohms performed in our laboratory with EKG monitoring in an awake and asleep patient.  Hyperventilation and photic stimulation were performed.  The digital EEG was referentially recorded, reformatted, and digitally filtered in a variety of bipolar and referential montages for optimal display.    Description: The patient is awake and asleep during the recording.  During maximal wakefulness, there is a symmetric, medium voltage 9 Hz posterior dominant rhythm that attenuates with eye opening.  There is occasional focal 2-3 Hz delta slowing over the left temporal region. During drowsiness and stage I sleep, there is an increase in theta and delta slowing of the background, with occasional vertex waves seen.  Hyperventilation and photic stimulation were not performed.  There were no epileptiform discharges or electrographic seizures seen.    EKG lead showed sinus bradycardia.  Impression: This awake and asleep EEG is abnormal due to occasional focal slowing over the left temporal region.  Clinical Correlation of the above findings indicates focal cerebral dysfunction over the left temporal region suggestive of underlying structural or physiologic abnormality. The absence of epileptiform discharges does not exclude a clinical diagnosis of epilepsy.  Clinical correlation is advised.   Ellouise Newer, M.D.

## 2015-07-30 NOTE — Clinical Social Work Note (Signed)
Clinical Social Work Assessment  Patient Details  Name: Charles [name] MRN: [id number] Date of Birth: [birth year]  Date of referral:  07/30/15               Reason for consult:  Discharge Planning, Facility Placement                Permission sought to share information with:  Family Supports, Chartered certified accountant granted to share information::  Yes, Verbal Permission Granted  Name::     Jazz (son)  Agency::  SNFs  Relationship::     Contact Information:     Housing/Transportation Living arrangements for the past 2 months:  [place] of Information:  Adult Children Patient Interpreter Needed:  None Criminal Activity/Legal Involvement Pertinent to Current Situation/Hospitalization:  No - Comment as needed Significant Relationships:  Adult Children Lives with:  Facility Resident Do you feel safe going back to the place where you live?  No Need for family participation in patient care:  Yes (Comment)  Care giving concerns:  Patient's son does not feel the patient can go back to [facility] at this time. He requests SNF placement.   Social Worker assessment / plan:  Patient's son Dagan Heinz states that the patient was admitted from [facility], but does not feel the patient can be managed at this level of care at discharge. Yvone Neu is agreeable to SNF placement at discharge. CSW explained SNF search/placement process and answered Ken's questions. Yvone Neu prefers [facility] or [facility]. CSW explained that we cannot guarantee placement at any particular facility but the referral would be made. CSW has contacted the patient's insurance company to make them aware of probable weekend DC. Insurance company Agricultural engineer) states they will be able to give weekend CSW authorization for SNF over the weekend if patient is ready for DC. Will ask weekend CSW to followup with son re: bed offers. Weekend CSW will be provided with  handoff.  Employment status:  Disabled (Comment on whether or not currently receiving Disability) Insurance information:  Managed Medicare PT Recommendations:  [place] / Referral to community resources:  [place]  Patient/Family's Response to care:  Patient's son appears to be happy with the care the patient has received.  Patient/Family's Understanding of and Emotional Response to Diagnosis, Current Treatment, and Prognosis:  Patient has little to no insight into reason for admission and post DC needs. Patient's son Yvone Neu is fully aware of patient's condition and post DC needs.   Emotional Assessment Appearance:  Appears stated age Attitude/Demeanor/Rapport:  Unable to Assess (Patient is confused) Affect (typically observed):  Unable to Assess (Patient is confused) Orientation:    Alcohol / Substance use:  Never Used Psych involvement (Current and /or in the community):  No (Comment)  Discharge Needs  Concerns to be addressed:  Discharge Planning Concerns Readmission within the last 30 days:  No Current discharge risk:  Chronically ill, Physical Impairment, Cognitively Impaired Barriers to Discharge:  Continued Medical Work up   [employer] MSW, [place], [place], [phone]

## 2015-07-31 DIAGNOSIS — I480 Paroxysmal atrial fibrillation: Secondary | ICD-10-CM

## 2015-07-31 DIAGNOSIS — I639 Cerebral infarction, unspecified: Secondary | ICD-10-CM

## 2015-07-31 DIAGNOSIS — E785 Hyperlipidemia, unspecified: Secondary | ICD-10-CM

## 2015-07-31 MED ORDER — ATORVASTATIN CALCIUM 40 MG PO TABS: 40.0000 mg | ORAL_TABLET | Freq: Every day | ORAL | Status: AC

## 2015-07-31 MED ORDER — CEPHALEXIN 500 MG PO CAPS
500.0000 mg | ORAL_CAPSULE | Freq: Two times a day (BID) | ORAL | Status: AC
Start: 2015-07-31 — End: 2015-08-01

## 2015-07-31 MED ORDER — APIXABAN 5 MG PO TABS: 5.0000 mg | ORAL_TABLET | Freq: Two times a day (BID) | ORAL | Status: DC

## 2015-07-31 NOTE — Discharge Summary (Signed)
Name: Charles [name] MRN: [id number] DOB: 01-Jun-1918 81 y.o. PCP: Charles Rasmussen, MD  Date of Admission: 07/28/2015  6:53 PM Date of Discharge: 07/31/2015 Attending Physician: Charles Contes, MD  Discharge Diagnosis:  Active Problems:   CVA (cerebral infarction)   Stroke   Paroxysmal atrial fibrillation   HLD (hyperlipidemia)  Discharge Medications:   Medication List    STOP taking these medications        HYDROcodone-acetaminophen 5-325 MG per tablet  Commonly known as:  NORCO/VICODIN     pravastatin 20 MG tablet  Commonly known as:  PRAVACHOL      TAKE these medications        allopurinol 100 MG tablet  Commonly known as:  ZYLOPRIM  Take 100 mg by mouth daily.     amiodarone 200 MG tablet  Commonly known as:  PACERONE  Take 100 mg by mouth daily.     apixaban 5 MG Tabs tablet  Commonly known as:  ELIQUIS  Take 1 tablet (5 mg total) by mouth every 12 (twelve) hours.     aspirin EC 81 MG tablet  Take 81 mg by mouth daily.     atorvastatin 40 MG tablet  Commonly known as:  LIPITOR  Take 1 tablet (40 mg total) by mouth daily.     bisoprolol-hydrochlorothiazide 10-6.25 MG per tablet  Commonly known as:  ZIAC  Take 1 tablet by mouth daily.     cephALEXin 500 MG capsule  Commonly known as:  KEFLEX  Take 1 capsule (500 mg total) by mouth 2 (two) times daily.     FISH OIL PO  Take 1 tablet by mouth every evening.     gabapentin 100 MG capsule  Commonly known as:  NEURONTIN  Take 200 mg by mouth at bedtime.        Disposition and follow-up:   [name] was discharged from [facility] in Stable condition.  At the hospital follow up visit please address:  1.  Please follow up with PCP and Neurology. Started Eliquis for Afib.  Changed statin from pravastatin to Lipitor 40mg  daily.   2.  Labs / imaging needed at time of follow-up: None  3.  Pending labs/ test needing follow-up: None  Follow-up Appointments:     Follow-up  Information    Follow up with Xu,Jindong, MD. Schedule an appointment as soon as possible for a visit in 2 months.   Specialty:  Neurology   Why:  stroke clinic   Contact information:   [address] [place] [address] [phone]       Follow up with Charles [name]., MD.   Specialty:  Family Medicine   Contact information:   [place] [address] [phone]       Discharge Instructions: Discharge Instructions    Ambulatory referral to Neurology    Complete by:  As directed   Pt will follow up with Dr. Erlinda [name] at [facility] in about 2 months. Thanks.           Consultations:    Procedures Performed:  Dg Chest 2 View  07/25/2015   CLINICAL DATA:  Urinary frequency. Suprapubic catheter. Low back pain. Increased weakness and difficulty ambulating.  EXAM: CHEST  2 VIEW  COMPARISON:  None.  FINDINGS: Mild-to-moderate enlargement of the cardiopericardial silhouette noted with aortic tortuosity and atherosclerotic calcification of the aortic arch.  Thoracic spondylosis. The lungs appear clear aside from some mild scarring or  atelectasis along the major fissure.  IMPRESSION: 1. No acute thoracic findings. 2. Mild to moderate enlargement of the cardiopericardial silhouette, without edema. Atherosclerotic aortic arch. 3. Thoracic spondylosis.   Electronically Signed   By: Charles [name] M.D.   On: 07/25/2015 20:41   Ct Head Wo Contrast  07/28/2015   CLINICAL DATA:  Code stroke. Small down after waking up from that. Right-sided deficits.  EXAM: CT HEAD WITHOUT CONTRAST  TECHNIQUE: Contiguous axial images were obtained from the base of the skull through the vertex without intravenous contrast.  COMPARISON:  None.  FINDINGS: Ventricles and sulci are prominent compatible with atrophy. Focal hypodensity within the left thalamus, most compatible with remote infarct. Left frontal parietal lobe low-attenuation (image 22; series 21). No evidence for acute  intracranial hemorrhage or significant mass effect. Paranasal sinuses are well aerated. Mastoid air cells unremarkable. Calvarium is intact. Prominent arachnoid granulations within the occipital bone.  IMPRESSION: Regional low attenuation within the left frontal parietal lobe may represent age-indeterminate, potentially subacute or infarct. Chronic left alignment lacunar infarct.  Critical Value/emergent results were called by telephone at the time of interpretation on 07/28/2015 at 7:11 pm to Charles [name], who verbally acknowledged these results.   Electronically Signed   By: Lovey Newcomer M.D.   On: 07/28/2015 19:19   Mr Jodene Nam Head Wo Contrast  07/29/2015   CLINICAL DATA:  4 hours of right-sided weakness with slurred speech.  EXAM: MRI HEAD WITHOUT CONTRAST  MRA HEAD WITHOUT CONTRAST  TECHNIQUE: Multiplanar, multiecho pulse sequences of the brain and surrounding structures were obtained without intravenous contrast. Angiographic images of the head were obtained using MRA technique without contrast.  COMPARISON:  None.  FINDINGS: MRI HEAD FINDINGS  Motion degraded study.  Calvarium and upper cervical spine: No focal marrow signal abnormality.  Orbits: Bilateral cataract resection.  No acute finding.  Sinuses and Mastoids: Clear. Mastoid and middle ears are clear.  Brain: There are sub cm areas of restricted diffusion in the left paramedian midbrain, including periaqueductal gray matter and along the interpeduncular fossa, lateral left thalamus, and left occipital subcortical white matter.  Remote cortical and subcortical infarct involving the posterior left frontal lobe, central MCA territory. Remote lacunar infarct in the left thalamus and periatrial white matter on the right.  Probable hygromas in the posterior fossa, without mass effect.  MRA HEAD FINDINGS  Motion degraded study.  Symmetric carotid arteries. Bilateral fetal type PCAs. Symmetric vertebral arteries. Dominant left AICA and right PICA.  There is diffuse  medium vessel irregularity and narrowing consistent with atherosclerosis, moderate to advanced. When accounting for motion artifact at slab interface, proximal basilar artery narrowing is likely artifactual. There is mild narrowing of the distal basilar which is likely atherosclerotic. No evidence of intramural hematoma on T1 weighted imaging, as permitted by motion artifact. At the expected location of the left P1 -P2 junction there is a moderate focal stenosis, of note given the pattern of infarctions.  No evidence of aneurysm.  IMPRESSION: 1. Small acute nonhemorrhagic infarcts in the left paramedian midbrain, left thalamus, and left occipital white matter. 2. As permitted by motion, no acute arterial finding. The bilateral posterior cerebral arteries are fetal type. 3. Diffuse intracranial atherosclerosis without treatable flow limiting stenosis.   Electronically Signed   By: Monte Fantasia M.D.   On: 07/29/2015 10:11   Mr Brain Wo Contrast  07/29/2015   CLINICAL DATA:  4 hours of right-sided weakness with slurred speech.  EXAM: MRI HEAD WITHOUT CONTRAST  MRA HEAD WITHOUT CONTRAST  TECHNIQUE: Multiplanar, multiecho pulse sequences of the brain and surrounding structures were obtained without intravenous contrast. Angiographic images of the head were obtained using MRA technique without contrast.  COMPARISON:  None.  FINDINGS: MRI HEAD FINDINGS  Motion degraded study.  Calvarium and upper cervical spine: No focal marrow signal abnormality.  Orbits: Bilateral cataract resection.  No acute finding.  Sinuses and Mastoids: Clear. Mastoid and middle ears are clear.  Brain: There are sub cm areas of restricted diffusion in the left paramedian midbrain, including periaqueductal gray matter and along the interpeduncular fossa, lateral left thalamus, and left occipital subcortical white matter.  Remote cortical and subcortical infarct involving the posterior left frontal lobe, central MCA territory. Remote lacunar  infarct in the left thalamus and periatrial white matter on the right.  Probable hygromas in the posterior fossa, without mass effect.  MRA HEAD FINDINGS  Motion degraded study.  Symmetric carotid arteries. Bilateral fetal type PCAs. Symmetric vertebral arteries. Dominant left AICA and right PICA.  There is diffuse medium vessel irregularity and narrowing consistent with atherosclerosis, moderate to advanced. When accounting for motion artifact at slab interface, proximal basilar artery narrowing is likely artifactual. There is mild narrowing of the distal basilar which is likely atherosclerotic. No evidence of intramural hematoma on T1 weighted imaging, as permitted by motion artifact. At the expected location of the left P1 -P2 junction there is a moderate focal stenosis, of note given the pattern of infarctions.  No evidence of aneurysm.  IMPRESSION: 1. Small acute nonhemorrhagic infarcts in the left paramedian midbrain, left thalamus, and left occipital white matter. 2. As permitted by motion, no acute arterial finding. The bilateral posterior cerebral arteries are fetal type. 3. Diffuse intracranial atherosclerosis without treatable flow limiting stenosis.   Electronically Signed   By: Monte Fantasia M.D.   On: 07/29/2015 10:11   Dg Hip Unilat With Pelvis 2-3 Views Right  07/02/2015   CLINICAL DATA:  Right hip pain for several months without known injury. Initial encounter.  EXAM: DG HIP (WITH OR WITHOUT PELVIS) 2-3V RIGHT  COMPARISON:  None.  FINDINGS: There is no evidence of hip fracture or dislocation. There is no evidence of arthropathy or other focal bone abnormality.  IMPRESSION: Normal right hip.   Electronically Signed   By: Marijo Conception, M.D.   On: 07/02/2015 15:06    2D Echo:   Study Conclusions  - Left ventricle: The cavity size was normal. Wall thickness was increased in a pattern of moderate LVH. Systolic function was normal. The estimated ejection fraction was in the range of  50% to 55%. Wall motion was normal; there were no regional wall motion abnormalities. Doppler parameters are consistent with abnormal left ventricular relaxation (grade 1 diastolic dysfunction). - Aortic valve: There was mild regurgitation. - Mitral valve: Calcified annulus. - Right ventricle: The cavity size was mildly dilated. - Right atrium: The atrium was mildly dilated.  Impressions:  - No cardiac source of emboli was indentified.  Admission HPI:   Charles [name] is a 81 yo male with HTN and arrhythmia on Amiodarone, presenting with 4 hour h/o right sided weakness and slurred speech. History was obtained from the patient's son. Per son, the patient was found down at home by a home health nurse. It is unclear what time the symptoms started. Per son, there the patient was conscious, but confused, with slurred speech and right sided weakness. There was no report of LOC or loss of bowel/bladder control.  In the ED, his weakness had resolved, though the patient continued to have slurred, though intelligible speech. Per son, the patient has intermittently had trouble opening his eyes, although they have spontaneously opened while in the ED. The patient has had one stroke in the past, about 6 years ago. At that time he difficulty with speech, though symptoms had resolved over the next 6 months. Of note, this HPI differs from the story provided to the Neurology physician  Per patient's son, patient has not been complaining of fever, chills, CP, SOB, N/V, C/D, or difficulty urinating.  The patient has a suprapubic catheter 2/2 BPH. He was seen at [facility] ED in August 2016 for weakness, confusion, and UTI. UTI symptoms had not resolved by 9/4, and patient's antibiotic were switched to Keflex (9/4-9/11).  The son does not know the nature of his cardiac arrhythmia, but reports that it occurred during a hospitalization. The patient was discharged on amiodarone and a beta blocker.   Prior  to this episode, patient was able to perform all ADLs and IADLs. 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  In the ED, his initial NIHSS was 8. tPA was not administered, as it was unclear how long the patient had been having symptoms.   Patient's advanced directive states that he is DNR/DNI, but would like medical therapies.  Hospital Course by problem list: Active Problems:   CVA (cerebral infarction)   Stroke   Paroxysmal atrial fibrillation   HLD (hyperlipidemia)   Charles [name] is a 81 y.o. male with history of hypertension, stroke, a-fib(on amiodarone), BPH with suprapubic catheter and recent UTI on Keflex here with Stroke.  Stroke: left paramedian, left thalamus, left occipoparital infarcts, also remote cortical/subcortical infarcts b/l. SEE IMAGING RESULTS. Likely embolic from Afib.  ECHO no thrombus, carotid doppler normal.  - appreciate neuro recs: start Eliquis for Afib. Cont ASA 81mg  daily.  -LDL 92, Hgb A1c 6.2 - cont lipitor.  - appreciate PT rec, will continue PT at SNF.  Atrial fibrillation: history of irregular heart beat, rate controlled on amiodarone - Spoke with patients cardiologist in [place], records indicate that he has atrial fibrillation -continue amiodarone  Hypertension: on bisoprolol-HCTZ at home -Allowed permissive HTN for 48 hours, now back on bisoprolol-HCTZ 10-6.25mg   Hyperlipidemia: on pravastatin at home -LDL 92 with goal <70 , changed pravastatin to Lipitor 40mg  daily.   Urinary tract infection: should complete Keflex 500mg  bid on 9/11 -UA with moderate leukocytes -continue Keflex 500mg  PO BID until 9/11 to finish course.    Discharge Vitals:   BP 194/84 mmHg  Pulse 59  Temp(Src) 98.2 F (36.8 C) (Oral)  Resp 18  Ht 6\' 1"  (1.854 m)  Wt 158 lb 4.6 oz (71.8 kg)  BMI 20.89 kg/m2  SpO2 96%  Discharge Labs:  No results found for this or any previous visit (from the past 24 hour(s)).  Signed: Dellia Nims,  MD 07/31/2015, 1:42 PM    Services Ordered on Discharge:  Equipment Ordered on Discharge:

## 2015-07-31 NOTE — Progress Notes (Signed)
Internal Medicine Attending  Date: 07/31/2015  Patient name: Charles [name] Medical record number: [id number] Date of birth: 02/22/1918 Age: 81 y.o. Gender: male  I saw and evaluated the patient. I reviewed the resident's note by Dr. Genene Churn and I agree with the resident's findings and plans as documented in his progress note.  Mr. Hitchens had no complaints this morning when seen on rounds. Physical exam was notable for 5/5 strength throughout and no evidence of slurred speech. We will treat for a presumed embolic stroke given the distribution seen on imaging and his documented history of atrial fibrillation. He is ready for discharge to a skilled nursing facility for further rehabilitation.

## 2015-07-31 NOTE — Progress Notes (Signed)
Called report to receiving nurse Colletta [place])  at [facility] at [place]. Pt. Son is transporting pt. To facility. Gave son D/C packet prepped by CSW and instructed him to give to nurse at facility. Removed IV. All needs met. No further needs noted at this time.

## 2015-07-31 NOTE — Discharge Instructions (Signed)
Please follow up with primary care and stroke doctor.  Take Eliquis from now to prevent future strokes from your Afib.

## 2015-07-31 NOTE — Clinical Social Work Placement (Signed)
   CLINICAL SOCIAL WORK PLACEMENT  NOTE  Date:  07/31/2015  Patient Details  Name: Charles [name] MRN: [id number] Date of Birth: 1918/01/24  Clinical Social Work is seeking post-discharge placement for this patient at the [place] level of care (*CSW will initial, date and re-position this form in  chart as items are completed):  Yes   Patient/family provided with [place] Work Department's list of facilities offering this level of care within the geographic area requested by the patient (or if unable, by the patient's family).  Yes   Patient/family informed of their freedom to choose among providers that offer the needed level of care, that participate in Medicare, Medicaid or managed care program needed by the patient, have an available bed and are willing to accept the patient.  Yes   Patient/family informed of [facility]'s ownership interest in [facility] and [facility], as well as of the fact that they are under no obligation to receive care at these facilities.  PASRR submitted to EDS on 07/30/15     PASRR number received on 07/30/15     Existing PASRR number confirmed on       FL2 transmitted to all facilities in geographic area requested by pt/family on 07/30/15     FL2 transmitted to all facilities within larger geographic area on       Patient informed that his/her managed care company has contracts with or will negotiate with certain facilities, including the following:        Yes   Patient/family informed of bed offers received.  Patient chooses bed at [facility]     Physician recommends and patient chooses bed at      Patient to be transferred to [facility] [place] on 07/31/15.  Patient to be transferred to facility by Car, Son     Patient family notified on 07/31/15 of transfer.  Name of family member notified:  [name], son     PHYSICIAN       Additional Comment:     _______________________________________________ Christene Lye, LCSW 07/31/2015, 3:52 PM

## 2015-07-31 NOTE — Social Work (Signed)
Facility, [facility] [place] is available to admit patient on 9/10 or 9/11. At this time awaiting insurance authorization.  Christene Lye MSW, [place]

## 2015-07-31 NOTE — Progress Notes (Signed)
Subjective: Doing well. No issues overnight. No complaints. Started eliquis yesterday. No bleeding.   Objective: Vital signs in last 24 hours: Filed Vitals:   07/30/15 1415 07/30/15 2137 07/31/15 0458 07/31/15 0502  BP: 144/67 151/65 188/85 194/84  Pulse: 52 52 58 59  Temp: 98.7 F (37.1 C) 98.2 F (36.8 C) 98.2 F (36.8 C)   TempSrc:  Oral Oral   Resp: 16 18 18    Height:      Weight:      SpO2: 95% 98% 96% 96%   Weight change:   Intake/Output Summary (Last 24 hours) at 07/31/15 0939 Last data filed at 07/31/15 0915  Gross per 24 hour  Intake    248 ml  Output    275 ml  Net    -27 ml   General: resting in bed, no distress HEENT: no scleral icterus Cardiac: bradycardic, regular rhythm Pulm: clear to auscultation bilaterally Abd: soft, nontender, nondistended, BS present Ext: warm and well perfused, no pedal edema GU: suprapubic catheter in place Neuro: alert and oriented.  Knows that he is in the hospital in [place] and what the year and month is. Strength 5/5 lower extremities, 5/5 upper extremities  Lab Results: Basic Metabolic Panel:  Recent Labs Lab 07/29/15 1146 07/30/15 0700  NA 141 140  K 3.5 3.5  CL 107 108  CO2 26 24  GLUCOSE 97 97  BUN 20 18  CREATININE 1.31* 1.22  CALCIUM 8.9 8.8*   Liver Function Tests:  Recent Labs Lab 07/25/15 1705 07/28/15 1858  AST 35 26  ALT 30 26  ALKPHOS 60 59  BILITOT 0.7 0.7  PROT 6.4* 5.7*  ALBUMIN 3.3* 3.1*   CBC:  Recent Labs Lab 07/25/15 1705 07/28/15 1858  07/29/15 1146 07/30/15 0700  WBC 6.4 5.6  --  6.3 5.7  NEUTROABS 5.0 4.4  --   --   --   HGB 12.1* 11.8*  < > 12.9* 12.4*  HCT 39.4 37.9*  < > 41.6 40.0  MCV 79.6 79.3  --  79.1 78.3  PLT 256 187  --  213 200  < > = values in this interval not displayed. Cardiac Enzymes:  Recent Labs Lab 07/25/15 2056  TROPONINI <0.03   Fasting Lipid Panel:  Recent Labs Lab 07/29/15 1146  CHOL 159  HDL 41  LDLCALC 92  TRIG 130  CHOLHDL  3.9   Coagulation:  Recent Labs Lab 07/28/15 1858  LABPROT 14.1  INR 1.07   Anemia Panel: No results for input(s): VITAMINB12, FOLATE, FERRITIN, TIBC, IRON, RETICCTPCT in the last 168 hours. Urine Drug Screen: Drugs of Abuse     Component Value Date/Time   LABOPIA NONE DETECTED 07/29/2015 0452   COCAINSCRNUR NONE DETECTED 07/29/2015 0452   LABBENZ NONE DETECTED 07/29/2015 0452   AMPHETMU NONE DETECTED 07/29/2015 0452   THCU NONE DETECTED 07/29/2015 0452   LABBARB NONE DETECTED 07/29/2015 0452    Alcohol Level:  Recent Labs Lab 07/28/15 1858  ETH <5   Urinalysis:  Recent Labs Lab 07/25/15 1658 07/29/15 0452  COLORURINE YELLOW YELLOW  LABSPEC 1.016 1.017  PHURINE 5.5 6.5  GLUCOSEU NEGATIVE NEGATIVE  HGBUR MODERATE* NEGATIVE  BILIRUBINUR NEGATIVE NEGATIVE  KETONESUR NEGATIVE NEGATIVE  PROTEINUR 30* NEGATIVE  UROBILINOGEN 0.2 0.2  NITRITE NEGATIVE NEGATIVE  LEUKOCYTESUR MODERATE* MODERATE*     Micro Results: Recent Results (from the past 240 hour(s))  Urine culture     Status: None   Collection Time: 07/25/15  5:26 PM  Result Value Ref Range Status   Specimen Description URINE, RANDOM  Final   Special Requests NONE  Final   Culture NO GROWTH 1 DAY  Final   Report Status 07/26/2015 FINAL  Final   Studies/Results: Mr Virgel Paling Wo Contrast  07/29/2015   CLINICAL DATA:  4 hours of right-sided weakness with slurred speech.  EXAM: MRI HEAD WITHOUT CONTRAST  MRA HEAD WITHOUT CONTRAST  TECHNIQUE: Multiplanar, multiecho pulse sequences of the brain and surrounding structures were obtained without intravenous contrast. Angiographic images of the head were obtained using MRA technique without contrast.  COMPARISON:  None.  FINDINGS: MRI HEAD FINDINGS  Motion degraded study.  Calvarium and upper cervical spine: No focal marrow signal abnormality.  Orbits: Bilateral cataract resection.  No acute finding.  Sinuses and Mastoids: Clear. Mastoid and middle ears are clear.   Brain: There are sub cm areas of restricted diffusion in the left paramedian midbrain, including periaqueductal gray matter and along the interpeduncular fossa, lateral left thalamus, and left occipital subcortical white matter.  Remote cortical and subcortical infarct involving the posterior left frontal lobe, central MCA territory. Remote lacunar infarct in the left thalamus and periatrial white matter on the right.  Probable hygromas in the posterior fossa, without mass effect.  MRA HEAD FINDINGS  Motion degraded study.  Symmetric carotid arteries. Bilateral fetal type PCAs. Symmetric vertebral arteries. Dominant left AICA and right PICA.  There is diffuse medium vessel irregularity and narrowing consistent with atherosclerosis, moderate to advanced. When accounting for motion artifact at slab interface, proximal basilar artery narrowing is likely artifactual. There is mild narrowing of the distal basilar which is likely atherosclerotic. No evidence of intramural hematoma on T1 weighted imaging, as permitted by motion artifact. At the expected location of the left P1 -P2 junction there is a moderate focal stenosis, of note given the pattern of infarctions.  No evidence of aneurysm.  IMPRESSION: 1. Small acute nonhemorrhagic infarcts in the left paramedian midbrain, left thalamus, and left occipital white matter. 2. As permitted by motion, no acute arterial finding. The bilateral posterior cerebral arteries are fetal type. 3. Diffuse intracranial atherosclerosis without treatable flow limiting stenosis.   Electronically Signed   By: Monte Fantasia M.D.   On: 07/29/2015 10:11   Mr Brain Wo Contrast  07/29/2015   CLINICAL DATA:  4 hours of right-sided weakness with slurred speech.  EXAM: MRI HEAD WITHOUT CONTRAST  MRA HEAD WITHOUT CONTRAST  TECHNIQUE: Multiplanar, multiecho pulse sequences of the brain and surrounding structures were obtained without intravenous contrast. Angiographic images of the head were  obtained using MRA technique without contrast.  COMPARISON:  None.  FINDINGS: MRI HEAD FINDINGS  Motion degraded study.  Calvarium and upper cervical spine: No focal marrow signal abnormality.  Orbits: Bilateral cataract resection.  No acute finding.  Sinuses and Mastoids: Clear. Mastoid and middle ears are clear.  Brain: There are sub cm areas of restricted diffusion in the left paramedian midbrain, including periaqueductal gray matter and along the interpeduncular fossa, lateral left thalamus, and left occipital subcortical white matter.  Remote cortical and subcortical infarct involving the posterior left frontal lobe, central MCA territory. Remote lacunar infarct in the left thalamus and periatrial white matter on the right.  Probable hygromas in the posterior fossa, without mass effect.  MRA HEAD FINDINGS  Motion degraded study.  Symmetric carotid arteries. Bilateral fetal type PCAs. Symmetric vertebral arteries. Dominant left AICA and right PICA.  There is diffuse medium vessel irregularity and narrowing consistent  with atherosclerosis, moderate to advanced. When accounting for motion artifact at slab interface, proximal basilar artery narrowing is likely artifactual. There is mild narrowing of the distal basilar which is likely atherosclerotic. No evidence of intramural hematoma on T1 weighted imaging, as permitted by motion artifact. At the expected location of the left P1 -P2 junction there is a moderate focal stenosis, of note given the pattern of infarctions.  No evidence of aneurysm.  IMPRESSION: 1. Small acute nonhemorrhagic infarcts in the left paramedian midbrain, left thalamus, and left occipital white matter. 2. As permitted by motion, no acute arterial finding. The bilateral posterior cerebral arteries are fetal type. 3. Diffuse intracranial atherosclerosis without treatable flow limiting stenosis.   Electronically Signed   By: Monte Fantasia M.D.   On: 07/29/2015 10:11   Medications: Scheduled  Meds: .  stroke: mapping our early stages of recovery book   Does not apply Once  . amiodarone  100 mg Oral Daily  . antiseptic oral rinse  7 mL Mouth Rinse q12n4p  . apixaban  5 mg Oral Q12H  . aspirin  81 mg Oral Once  . bisoprolol-hydrochlorothiazide  1 tablet Oral Daily  . cephALEXin  500 mg Oral BID  . chlorhexidine  15 mL Mouth Rinse BID  . pravastatin  20 mg Oral q1800  . sodium chloride  3 mL Intravenous Q12H   Continuous Infusions:  PRN Meds:.acetaminophen **OR** acetaminophen Assessment/Plan: Active Problems:   CVA (cerebral infarction)   Stroke   Paroxysmal atrial fibrillation   HLD (hyperlipidemia)  Charles [name] is a 81 y.o. male with history of hypertension, stroke, a-fib(on amiodarone), BPH with suprapubic catheter and recent UTI on Keflex.  Stroke: left paramedian, left thalamus, left occipoparital infarcts, also remote cortical/subcortical infarcts b/l. Likely embolic from Afib.  - appreciate neuro recs: start Eliquis for Afib. Cont ASA 81mg  daily.  -LDL 92, Hgb A1c 6.2 - cont lipitor.  - appreciate PT rec, will continue PT at SNF.  Atrial fibrillation: history of irregular heart beat, rate controlled on amiodarone - Spoke with patients cardiologist in [place], records indicate that he has atrial fibrillation -continue amiodarone  Hypertension: on bisoprolol-HCTZ at home -Allowed permissive HTN for 48 hours, now back on Ziac 10-6.25mg   Hyperlipidemia: on pravastatin at home -LDL 92 with goal <70 - continue pravstatin  Urinary tract infection: should complete Keflex 500mg  bid on 9/11 -UA with moderate leukocytes -continue Keflex 500mg  PO BID until 9/11.   Dispo: Disposition is deferred at this time, awaiting improvement of current medical problems.  Anticipate discharge to SNF after speaking with Social Work - Engineer, building services.  The patient does have a current PCP Hayden Rasmussen, MD) and does not need an [facility] hospital follow-up appointment after discharge.  The  patient does have transportation limitations that hinder transportation to clinic appointments.    LOS: 3 days   Dellia Nims, MD 07/31/2015, 9:39 AM

## 2015-08-09 ENCOUNTER — Emergency Department (HOSPITAL_BASED_OUTPATIENT_CLINIC_OR_DEPARTMENT_OTHER): Payer: PPO

## 2015-08-09 ENCOUNTER — Encounter (HOSPITAL_BASED_OUTPATIENT_CLINIC_OR_DEPARTMENT_OTHER): Payer: Self-pay | Admitting: *Deleted

## 2015-08-09 ENCOUNTER — Emergency Department (HOSPITAL_BASED_OUTPATIENT_CLINIC_OR_DEPARTMENT_OTHER)
Admission: EM | Admit: 2015-08-09 | Discharge: 2015-08-09 | Disposition: A | Discharge status: Home / Self Care | Payer: PPO | Attending: Emergency Medicine | Admitting: Emergency Medicine

## 2015-08-09 DIAGNOSIS — I4891 Unspecified atrial fibrillation: Secondary | ICD-10-CM | POA: Diagnosis not present

## 2015-08-09 DIAGNOSIS — W1839XA Other fall on same level, initial encounter: Secondary | ICD-10-CM | POA: Insufficient documentation

## 2015-08-09 DIAGNOSIS — Z79899 Other long term (current) drug therapy: Secondary | ICD-10-CM | POA: Diagnosis not present

## 2015-08-09 DIAGNOSIS — G4700 Insomnia, unspecified: Secondary | ICD-10-CM | POA: Insufficient documentation

## 2015-08-09 DIAGNOSIS — S0990XA Unspecified injury of head, initial encounter: Secondary | ICD-10-CM | POA: Diagnosis present

## 2015-08-09 DIAGNOSIS — S4992XA Unspecified injury of left shoulder and upper arm, initial encounter: Secondary | ICD-10-CM | POA: Diagnosis not present

## 2015-08-09 DIAGNOSIS — I10 Essential (primary) hypertension: Secondary | ICD-10-CM | POA: Insufficient documentation

## 2015-08-09 DIAGNOSIS — Y9389 Activity, other specified: Secondary | ICD-10-CM | POA: Insufficient documentation

## 2015-08-09 DIAGNOSIS — Y9289 Other specified places as the place of occurrence of the external cause: Secondary | ICD-10-CM | POA: Diagnosis not present

## 2015-08-09 DIAGNOSIS — Z8673 Personal history of transient ischemic attack (TIA), and cerebral infarction without residual deficits: Secondary | ICD-10-CM | POA: Diagnosis not present

## 2015-08-09 DIAGNOSIS — S4991XA Unspecified injury of right shoulder and upper arm, initial encounter: Secondary | ICD-10-CM | POA: Diagnosis not present

## 2015-08-09 DIAGNOSIS — Z7982 Long term (current) use of aspirin: Secondary | ICD-10-CM | POA: Insufficient documentation

## 2015-08-09 DIAGNOSIS — E785 Hyperlipidemia, unspecified: Secondary | ICD-10-CM | POA: Insufficient documentation

## 2015-08-09 DIAGNOSIS — S0011XA Contusion of right eyelid and periocular area, initial encounter: Secondary | ICD-10-CM | POA: Diagnosis not present

## 2015-08-09 DIAGNOSIS — M109 Gout, unspecified: Secondary | ICD-10-CM | POA: Diagnosis not present

## 2015-08-09 DIAGNOSIS — Y998 Other external cause status: Secondary | ICD-10-CM | POA: Insufficient documentation

## 2015-08-09 DIAGNOSIS — W19XXXA Unspecified fall, initial encounter: Secondary | ICD-10-CM

## 2015-08-09 HISTORY — DX: Insomnia, unspecified: G47.00

## 2015-08-09 HISTORY — DX: Gout, unspecified: M10.9

## 2015-08-09 HISTORY — DX: Hyperlipidemia, unspecified: E78.5

## 2015-08-09 HISTORY — DX: Unspecified atrial fibrillation: I48.91

## 2015-08-09 NOTE — ED Provider Notes (Signed)
CSN: [id number]     Arrival date & time 08/09/15  1250 History   First MD Initiated Contact with Patient 08/09/15 1313     Chief Complaint  Patient presents with  . Fall     (Consider location/radiation/quality/duration/timing/severity/associated sxs/prior Treatment) Patient is a 81 y.o. male presenting with fall. The history is provided by the patient.  Fall Associated symptoms include headaches. Pertinent negatives include no shortness of breath.   patient comes from medical rehabilitation facility. Reportedly had a fall for 5 days ago. Now complaining of a dull headache. He is on anticoagulation for atrial fibrillation and stroke. States he's been doing well in rehabilitation. Has some mild pain in his upper shoulders. No localizing numbness or weakness. No vision changes. States he will occasionally get some double vision but does not have right now. No fevers chills. States he needs a head CT.  Past Medical History  Diagnosis Date  . Hypertension   . Prostate enlargement   . Stroke     no residual weakness  . A-fib   . Hyperlipidemia   . Gout   . Insomnia    Past Surgical History  Procedure Laterality Date  . Suprapubic catheter insertion     No family history on file. Social History  Substance Use Topics  . Smoking status: Never Smoker   . Smokeless tobacco: None  . Alcohol Use: No    Review of Systems  Constitutional: Negative for appetite change.  Eyes: Positive for visual disturbance.  Respiratory: Negative for shortness of breath.   Musculoskeletal: Negative for back pain.  Skin: Negative for wound.  Neurological: Positive for headaches.  Psychiatric/Behavioral: Negative for confusion.      Allergies  Review of patient's allergies indicates no known allergies.  Home Medications   Prior to Admission medications   Medication Sig Start Date End Date Taking? Authorizing Provider  acetaminophen (TYLENOL) 325 MG tablet Take 650 mg by mouth every 4 (four)  hours as needed.   Yes Historical Provider, MD  allopurinol (ZYLOPRIM) 100 MG tablet Take 100 mg by mouth daily.   Yes Historical Provider, MD  amiodarone (PACERONE) 200 MG tablet Take 100 mg by mouth daily.   Yes Historical Provider, MD  apixaban (ELIQUIS) 5 MG TABS tablet Take 1 tablet (5 mg total) by mouth every 12 (twelve) hours. 07/31/15  Yes Tasrif Ahmed, MD  aspirin EC 81 MG tablet Take 81 mg by mouth daily.   Yes Historical Provider, MD  atorvastatin (LIPITOR) 40 MG tablet Take 1 tablet (40 mg total) by mouth daily. 07/31/15  Yes Tasrif Ahmed, MD  bisoprolol-hydrochlorothiazide (ZIAC) 10-6.25 MG per tablet Take 1 tablet by mouth daily.   Yes Historical Provider, MD  Cholecalciferol (VITAMIN D3) 2000 UNITS TABS Take 2,000 Units by mouth daily.   Yes Historical Provider, MD  gabapentin (NEURONTIN) 100 MG capsule Take 200 mg by mouth at bedtime.   Yes Historical Provider, MD  Melatonin 3 MG TABS Take 3 mg by mouth as needed (for insomnia).   Yes Historical Provider, MD  Omega-3 Fatty Acids (FISH OIL PO) Take 1 tablet by mouth every evening.   Yes Historical Provider, MD   BP 189/88 mmHg  Pulse 78  Temp(Src) 98.2 F (36.8 C) (Oral)  Resp 16  SpO2 96% Physical Exam  Constitutional: He appears well-developed.  HENT:  Slight periorbital ecchymosis on right side.  Eyes: EOM are normal. Pupils are equal, round, and reactive to light.  Cardiovascular: Normal rate.   Pulmonary/Chest: Effort  normal.  Abdominal: Soft. There is no tenderness.  Neurological: He is alert.  Patient is awake and appropriate. Good grip strength and leg raise bilaterally.  Skin: Skin is warm.  Psychiatric: He has a normal mood and affect.    ED Course  Procedures (including critical care time) Labs Review Labs Reviewed - No data to display  Imaging Review Ct Head Wo Contrast  08/09/2015   CLINICAL DATA:  81 year old male with acute fall 4 days ago with head injury, acute headache and blurred vision. Initial  encounter.  EXAM: CT HEAD WITHOUT CONTRAST  TECHNIQUE: Contiguous axial images were obtained from the base of the skull through the vertex without intravenous contrast.  COMPARISON:  07/28/2015 CT  FINDINGS: Generalized cerebral and cerebellar volume loss and moderate chronic small-vessel white matter ischemic changes again noted.  Remote left frontal and left thalamic infarcts again identified.  No acute intracranial abnormalities are identified, including mass lesion or mass effect, hydrocephalus, extra-axial fluid collection, midline shift, hemorrhage, or acute infarction.  The visualized bony calvarium is unremarkable. The visualized orbits and globes are unremarkable.  IMPRESSION: No evidence of acute intracranial abnormality.  Atrophy, chronic small-vessel white matter ischemic changes and remote infarcts as described.   Electronically Signed   By: Margarette Canada M.D.   On: 08/09/2015 13:56   I have personally reviewed and evaluated these images and lab results as part of my medical decision-making.   EKG Interpretation None      MDM   Final diagnoses:  Fall, initial encounter    Patient with fall and headache. Negative head CT. Patient is on anticoagulation. Will discharge home.    Davonna Belling, MD 08/09/15 1426

## 2015-08-09 NOTE — Discharge Instructions (Signed)

## 2015-08-09 NOTE — ED Notes (Signed)
EMS sts pt fell apprx. 4-5 days ago at his SNF and today is c/o posterior neck pain, right shoulder pain, and bruising around right eye. EMS sts pt did not indicate pain during palpation of neck or shoulder. EMS reports CMS intact in all extremities but pt placed in c-collar as a precaution. Pt c/o of some blurry vision in his right eye to EMS.

## 2015-08-26 ENCOUNTER — Encounter: Payer: Self-pay | Admitting: Neurology

## 2015-08-26 ENCOUNTER — Ambulatory Visit (INDEPENDENT_AMBULATORY_CARE_PROVIDER_SITE_OTHER): Payer: PPO | Admitting: Neurology

## 2015-08-26 VITALS — BP 107/52 | HR 46 | Ht 73.0 in | Wt 153.2 lb

## 2015-08-26 DIAGNOSIS — Z9359 Other cystostomy status: Secondary | ICD-10-CM | POA: Diagnosis not present

## 2015-08-26 DIAGNOSIS — I6340 Cerebral infarction due to embolism of unspecified cerebral artery: Secondary | ICD-10-CM | POA: Diagnosis not present

## 2015-08-26 DIAGNOSIS — Z8673 Personal history of transient ischemic attack (TIA), and cerebral infarction without residual deficits: Secondary | ICD-10-CM | POA: Diagnosis not present

## 2015-08-26 DIAGNOSIS — I480 Paroxysmal atrial fibrillation: Secondary | ICD-10-CM

## 2015-08-26 NOTE — Patient Instructions (Addendum)
-   due to frequent significant nose bleed, you requested to stop eliquis. We are OK with that but you have to know that you will have to accept higher risk of recurrent stroke - continue baby ASA and lipitor for stroke prevention - will request home health PT/OT - Follow up with your primary care physician for stroke risk factor modification. Recommend maintain blood pressure goal <130/80, diabetes with hemoglobin A1c goal below 6.5% and lipids with LDL cholesterol goal below 70 mg/dL.  - recommend cardiology follow up for afib - check BP at home  - follow up in 3 months.

## 2015-08-26 NOTE — Progress Notes (Signed)
STROKE NEUROLOGY FOLLOW UP NOTE  NAME: Charles [name] DOB: 22-Feb-1918  REASON FOR VISIT: stroke follow up HISTORY FROM: son and chart  Today we had the pleasure of seeing Charles [name] in follow-up at our Neurology Clinic. Pt was accompanied by son.   History Summary Charles [name] is a 81 y.o. male with history of HTN, stroke in 2010 with no residue, afib on amiodarone, and s/p suprapubic catheter with recent UTI admitted for AMS, dizziness, right facial droop, right side weakness, and nonverbal. Found to have AKI and dehydration. MRI also showed left paramedian, left thalamus and left occipitoparietal infarcts, consistent with embolic pattern, likely due to Afib not on AC. MRA showed diffuse intracranial athero. CUS and TTE unremarkable. EEG left temporal slowing but no seizure. LDL 92 and A1C 6.2. He was put on eliquis and lipitor on discharge back to ALF with HH.  Interval History During the interval time, the patient has been doing well. Slowly recovery from stroke. However, he still takes baby ASA in addition to eliquis. He took ASA 81 before the stroke daily without bleeding issue, but since taking eliquis, he had nose bleeding every the other day with significant amount. He would like to stop the eliquis. He has not get any home health PT or OT yet. His son stated that pt intermittently has double vision, confusion, seems more in the condition of fatigue. BP 107/52 today.  REVIEW OF SYSTEMS: Full 14 system review of systems performed and notable only for those listed below and in HPI above, all others are negative:  Constitutional:   Cardiovascular:  Ear/Nose/Throat:  Hearing loss Skin:  Eyes:  Double vision, blurry vision Respiratory:   Gastroitestinal:   Genitourinary:  Hematology/Lymphatic:   Endocrine:  Musculoskeletal:  Neck pain Allergy/Immunology:   Neurological:  Memory loss, speech difficulty Psychiatric: confusion Sleep:   The following represents the patient's  updated allergies and side effects list: No Known Allergies  The neurologically relevant items on the patient's problem list were reviewed on today's visit.  Neurologic Examination  A problem focused neurological exam (12 or more points of the single system neurologic examination, vital signs counts as 1 point, cranial nerves count for 8 points) was performed.  Blood pressure 107/52, pulse 46, height 6\' 1"  (1.854 m), weight 153 lb 3.2 oz (69.491 kg).  General - Well nourished, well developed, in no apparent distress.  Ophthalmologic - Fundi not visualized due to eye movement.  Cardiovascular - Regular rhythm, bradycardia.  Mental Status -  Level of arousal and orientation to time, place, and person were intact. Language including expression, naming, repetition, comprehension was assessed and found intact. Fund of Knowledge was assessed and was impaired.  Cranial Nerves II - XII - II - Visual field intact OU. III, IV, VI - Extraocular movements intact, no INO, no diplopia. V - Facial sensation intact bilaterally. VII - Facial movement intact bilaterally. VIII - Hearing & vestibular intact bilaterally. X - Palate elevates symmetrically. XI - Chin turning & shoulder shrug intact bilaterally. XII - Tongue protrusion intact.  Motor Strength - The patient's strength was normal in all extremities and pronator drift was absent.  Bulk was normal and fasciculations were absent.   Motor Tone - Muscle tone was assessed at the neck and appendages and was normal.  Reflexes - The patient's reflexes were 1+ in all extremities and he had no pathological reflexes.  Sensory - Light touch, temperature/pinprick were assessed and were normal.    Coordination -  The patient had normal movements in the hands and feet with no ataxia or dysmetria.  Tremor was absent.  Gait and Station - walk with walker, stooped posture, slow and small stride, steady.  Data reviewed: I personally reviewed the images  and agree with the radiology interpretations.  Dg Chest 2 View  07/25/2015 IMPRESSION: 1. No acute thoracic findings. 2. Mild to moderate enlargement of the cardiopericardial silhouette, without edema. Atherosclerotic aortic arch. 3. Thoracic spondylosis.   Ct Head Wo Contrast  07/28/2015 IMPRESSION: Regional low attenuation within the left frontal parietal lobe may represent age-indeterminate, potentially subacute or infarct. Chronic left alignment lacunar infarct.   Mri and Mra Head Wo Contrast  07/29/2015 IMPRESSION: 1. Small acute nonhemorrhagic infarcts in the left paramedian midbrain, left thalamus, and left occipital white matter. 2. As permitted by motion, no acute arterial finding. The bilateral posterior cerebral arteries are fetal type. 3. Diffuse intracranial atherosclerosis without treatable flow limiting stenosis.   Carotid Doppler Bilateral: 1-39% ICA stenosis. Vertebral artery flow is antegrade.  2D Echocardiogram - Left ventricle: The cavity size was normal. Wall thickness was increased in a pattern of moderate LVH. Systolic function was normal. The estimated ejection fraction was in the range of 50% to 55%. Wall motion was normal; there were no regional wall motion abnormalities. Doppler parameters are consistent with abnormal left ventricular relaxation (grade 1 diastolic dysfunction). - Aortic valve: There was mild regurgitation. - Mitral valve: Calcified annulus. - Right ventricle: The cavity size was mildly dilated. - Right atrium: The atrium was mildly dilated. Impressions: - No cardiac source of emboli was indentified.  EKG sinus bradycardia. For complete results please see formal report.  EEG - Impression: This awake and asleep EEG is abnormal due to occasional focal slowing over the left temporal region. Clinical Correlation of the above findings indicates focal cerebral dysfunction over the left temporal region suggestive of underlying  structural or physiologic abnormality. The absence of epileptiform discharges does not exclude a clinical diagnosis of epilepsy. Clinical correlation is advised  Component     Latest Ref Rng 07/29/2015  Cholesterol     0 - 200 mg/dL 159  Triglycerides     <150 mg/dL 130  HDL Cholesterol     >40 mg/dL 41  Total CHOL/HDL Ratio      3.9  VLDL     0 - 40 mg/dL 26  LDL (calc)     0 - 99 mg/dL 92  Hemoglobin A1C     4.8 - 5.6 % 6.2 (H)  Mean Plasma Glucose      131    Assessment: As you may recall, he is a 81 y.o. Caucasian male with PMH of HTN, stroke in 2010 with no residue, afib on amiodarone, and s/p suprapubic catheter with recent UTI admitted for AMS, dizziness, right facial droop, right side weakness, and nonverbal. Found to have AKI and dehydration. MRI also showed left paramedian, left thalamus and left occipitoparietal infarcts, consistent with embolic pattern, likely due to Afib not on AC. MRA showed diffuse intracranial athero. CUS and TTE unremarkable. EEG left temporal slowing but no seizure. LDL 92 and A1C 6.2. He was put on eliquis and lipitor on discharge back to ALF with HH. However, since taking eliquis, he had nose bleeding every the other day with significant amount. He would like to stop the eliquis. On exam, INO and the diplopia has resolved.  Plan:  - Discontinue Eliquis due to frequent nosebleeds and patient is aware that he may  have higher risk of recurrent stroke without anticoagulation. - continue baby ASA and lipitor for stroke prevention - will request home health PT/OT - Follow up with your primary care physician for stroke risk factor modification. Recommend maintain blood pressure goal <130/80, diabetes with hemoglobin A1c goal below 6.5% and lipids with LDL cholesterol goal below 70 mg/dL.  - recommend cardiology follow up for afib - check BP at home  - follow up in 3 months.  Orders Placed This Encounter  Procedures  . Ambulatory referral to Home Health      Referral Priority:  Routine    Referral Type:  Home Health Care    Referral Reason:  Specialty Services Required    Requested Specialty:  [place]    Number of Visits Requested:  1    Meds ordered this encounter  Medications  . DISCONTD: amiodarone (PACERONE) 100 MG tablet    Sig: Take 100 mg by mouth daily.    Refill:  0  . diclofenac sodium (VOLTAREN) 1 % GEL    Sig: APPLY 2GRAMS TO NECK & ACROSS SHOULDERS EVERY 6 HOURS WHILE AWAKE.    Refill:  0    Patient Instructions  - due to frequent significant nose bleed, you requested to stop eliquis. We are OK with that but you have to know that you will have to accept higher risk of recurrent stroke - continue baby ASA and lipitor for stroke prevention - will request home health PT/OT - Follow up with your primary care physician for stroke risk factor modification. Recommend maintain blood pressure goal <130/80, diabetes with hemoglobin A1c goal below 6.5% and lipids with LDL cholesterol goal below 70 mg/dL.  - recommend cardiology follow up for afib - check BP at home  - follow up in 3 months.    Rosalin Hawking, MD PhD [facility] [address] [place], [place] [address] [phone]

## 2015-08-27 DIAGNOSIS — Z8673 Personal history of transient ischemic attack (TIA), and cerebral infarction without residual deficits: Secondary | ICD-10-CM | POA: Insufficient documentation

## 2015-08-27 DIAGNOSIS — Z9359 Other cystostomy status: Secondary | ICD-10-CM | POA: Insufficient documentation

## 2015-09-06 ENCOUNTER — Telehealth: Payer: Self-pay | Admitting: Neurology

## 2015-09-06 NOTE — Telephone Encounter (Signed)
Telephone call from son stating that patient visiting nurse and the physician called him regarding patient having to episode of head drop, LOC, with mild hand tremor and shaking. Lasting about 10 seconds each, 2 hours apart while sitting in the chair. Son was told that patient heart rate was slow but did not mention BP. No post ictal confusion was reported. Son was told to call the office to make an appointment for evaluation of seizure.   I told the son that pt was here on 08/26/15 with BP at low side 107/52. He is on bisoprolol-HCTZ and he likely to have convulsive syncope due to orthostatic hypotension while sitting. Does not sound like seizure episode to me without body shaking or post ictal. Pt is going to see PCP Dr. Fredderick Phenix tomorrow. I recommend son to discuss with Dr. Fredderick Phenix to check orthostatic vitals and may consider adjusting his medications. Son expressed understanding and appreciation.  Will forward the note to Dr. Fredderick Phenix also.   Rosalin Hawking, MD PhD Stroke Neurology 09/06/2015 6:10 PM

## 2015-10-06 ENCOUNTER — Ambulatory Visit: Payer: PPO | Admitting: Neurology

## 2015-11-09 ENCOUNTER — Inpatient Hospital Stay (HOSPITAL_COMMUNITY): Payer: PPO

## 2015-11-09 ENCOUNTER — Inpatient Hospital Stay (HOSPITAL_COMMUNITY)
Admission: EM | Admit: 2015-11-09 | Discharge: 2015-11-10 | DRG: 690 | Disposition: A | Discharge status: Home / Self Care | Payer: PPO | Attending: Student in an Organized Health Care Education/Training Program | Admitting: Student in an Organized Health Care Education/Training Program

## 2015-11-09 ENCOUNTER — Encounter (HOSPITAL_COMMUNITY): Payer: Self-pay | Admitting: *Deleted

## 2015-11-09 DIAGNOSIS — N390 Urinary tract infection, site not specified: Principal | ICD-10-CM

## 2015-11-09 DIAGNOSIS — Z7982 Long term (current) use of aspirin: Secondary | ICD-10-CM

## 2015-11-09 DIAGNOSIS — T462X5A Adverse effect of other antidysrhythmic drugs, initial encounter: Secondary | ICD-10-CM | POA: Diagnosis present

## 2015-11-09 DIAGNOSIS — G4700 Insomnia, unspecified: Secondary | ICD-10-CM | POA: Diagnosis present

## 2015-11-09 DIAGNOSIS — D509 Iron deficiency anemia, unspecified: Secondary | ICD-10-CM | POA: Diagnosis present

## 2015-11-09 DIAGNOSIS — Z8673 Personal history of transient ischemic attack (TIA), and cerebral infarction without residual deficits: Secondary | ICD-10-CM

## 2015-11-09 DIAGNOSIS — M109 Gout, unspecified: Secondary | ICD-10-CM | POA: Diagnosis present

## 2015-11-09 DIAGNOSIS — E785 Hyperlipidemia, unspecified: Secondary | ICD-10-CM | POA: Diagnosis present

## 2015-11-09 DIAGNOSIS — N320 Bladder-neck obstruction: Secondary | ICD-10-CM | POA: Diagnosis present

## 2015-11-09 DIAGNOSIS — N400 Enlarged prostate without lower urinary tract symptoms: Secondary | ICD-10-CM | POA: Diagnosis present

## 2015-11-09 DIAGNOSIS — R001 Bradycardia, unspecified: Secondary | ICD-10-CM | POA: Diagnosis present

## 2015-11-09 DIAGNOSIS — R55 Syncope and collapse: Secondary | ICD-10-CM

## 2015-11-09 DIAGNOSIS — I10 Essential (primary) hypertension: Secondary | ICD-10-CM | POA: Diagnosis present

## 2015-11-09 DIAGNOSIS — Z66 Do not resuscitate: Secondary | ICD-10-CM | POA: Diagnosis present

## 2015-11-09 DIAGNOSIS — E876 Hypokalemia: Secondary | ICD-10-CM | POA: Diagnosis present

## 2015-11-09 DIAGNOSIS — R05 Cough: Secondary | ICD-10-CM | POA: Diagnosis not present

## 2015-11-09 DIAGNOSIS — I480 Paroxysmal atrial fibrillation: Secondary | ICD-10-CM | POA: Diagnosis present

## 2015-11-09 DIAGNOSIS — Z79899 Other long term (current) drug therapy: Secondary | ICD-10-CM | POA: Diagnosis not present

## 2015-11-09 DIAGNOSIS — R059 Cough, unspecified: Secondary | ICD-10-CM

## 2015-11-09 DIAGNOSIS — D649 Anemia, unspecified: Secondary | ICD-10-CM | POA: Diagnosis not present

## 2015-11-09 LAB — COMPREHENSIVE METABOLIC PANEL
ALBUMIN: 2.6 g/dL — AB (ref 3.5–5.0)
ALK PHOS: 455 U/L — AB (ref 38–126)
ALT: 76 U/L — AB (ref 17–63)
AST: 74 U/L — ABNORMAL HIGH (ref 15–41)
Anion gap: 8 (ref 5–15)
BUN: 23 mg/dL — ABNORMAL HIGH (ref 6–20)
CALCIUM: 8.2 mg/dL — AB (ref 8.9–10.3)
CHLORIDE: 109 mmol/L (ref 101–111)
CO2: 26 mmol/L (ref 22–32)
CREATININE: 1.38 mg/dL — AB (ref 0.61–1.24)
GFR calc Af Amer: 48 mL/min — ABNORMAL LOW (ref >60)
GFR calc non Af Amer: 42 mL/min — ABNORMAL LOW (ref >60)
GLUCOSE: 91 mg/dL (ref 65–99)
Potassium: 3.1 mmol/L — ABNORMAL LOW (ref 3.5–5.1)
SODIUM: 143 mmol/L (ref 135–145)
Total Bilirubin: 0.9 mg/dL (ref 0.3–1.2)
Total Protein: 5.7 g/dL — ABNORMAL LOW (ref 6.5–8.1)

## 2015-11-09 LAB — FERRITIN: Ferritin: 13 ng/mL — ABNORMAL LOW (ref 24–336)

## 2015-11-09 LAB — CBC
HCT: 29 % — ABNORMAL LOW (ref 39.0–52.0)
HEMOGLOBIN: 8.8 g/dL — AB (ref 13.0–17.0)
MCH: 22.6 pg — ABNORMAL LOW (ref 26.0–34.0)
MCHC: 30.3 g/dL (ref 30.0–36.0)
MCV: 74.6 fL — ABNORMAL LOW (ref 78.0–100.0)
PLATELETS: 261 10*3/uL (ref 150–400)
RBC: 3.89 MIL/uL — AB (ref 4.22–5.81)
RDW: 18.1 % — ABNORMAL HIGH (ref 11.5–15.5)
WBC: 5.9 10*3/uL (ref 4.0–10.5)

## 2015-11-09 LAB — URINALYSIS, ROUTINE W REFLEX MICROSCOPIC
Bilirubin Urine: NEGATIVE
GLUCOSE, UA: NEGATIVE mg/dL
KETONES UR: NEGATIVE mg/dL
Nitrite: POSITIVE — AB
PROTEIN: 30 mg/dL — AB
Specific Gravity, Urine: 1.019 (ref 1.005–1.030)
pH: 7 (ref 5.0–8.0)

## 2015-11-09 LAB — URINE MICROSCOPIC-ADD ON

## 2015-11-09 LAB — RETICULOCYTES
RBC.: 3.84 MIL/uL — ABNORMAL LOW (ref 4.22–5.81)
Retic Count, Absolute: 42.2 10*3/uL (ref 19.0–186.0)
Retic Ct Pct: 1.1 % (ref 0.4–3.1)

## 2015-11-09 LAB — TROPONIN I: Troponin I: <0.03 ng/mL (ref <0.031)

## 2015-11-09 LAB — POC OCCULT BLOOD, ED: FECAL OCCULT BLD: NEGATIVE

## 2015-11-09 MED ORDER — POTASSIUM CHLORIDE CRYS ER 20 MEQ PO TBCR
40.0000 meq | EXTENDED_RELEASE_TABLET | Freq: Once | ORAL | Status: AC
  Administered 2015-11-09: 40 meq via ORAL
  Filled 2015-11-09: qty 2

## 2015-11-09 MED ORDER — ENOXAPARIN SODIUM 40 MG/0.4ML ~~LOC~~ SOLN
40.0000 mg | SUBCUTANEOUS | Status: DC
  Administered 2015-11-09: 40 mg via SUBCUTANEOUS
  Filled 2015-11-09: qty 0.4

## 2015-11-09 MED ORDER — ATORVASTATIN CALCIUM 40 MG PO TABS
40.0000 mg | ORAL_TABLET | Freq: Every day | ORAL | Status: DC
  Administered 2015-11-10: 40 mg via ORAL
  Filled 2015-11-09: qty 1

## 2015-11-09 MED ORDER — ASPIRIN EC 81 MG PO TBEC
81.0000 mg | DELAYED_RELEASE_TABLET | Freq: Every day | ORAL | Status: DC
  Administered 2015-11-09 – 2015-11-10 (×2): 81 mg via ORAL
  Filled 2015-11-09 (×3): qty 1

## 2015-11-09 MED ORDER — ACETAMINOPHEN 325 MG PO TABS: 650.0000 mg | ORAL_TABLET | Freq: Four times a day (QID) | ORAL | Status: DC | PRN

## 2015-11-09 MED ORDER — ONDANSETRON HCL 4 MG/2ML IJ SOLN: 4.0000 mg | Freq: Three times a day (TID) | INTRAMUSCULAR | Status: DC | PRN

## 2015-11-09 MED ORDER — DEXTROSE 5 % IV SOLN
1.0000 g | INTRAVENOUS | Status: DC
  Administered 2015-11-09: 1 g via INTRAVENOUS
  Filled 2015-11-09 (×3): qty 10

## 2015-11-09 MED ORDER — ACETAMINOPHEN 650 MG RE SUPP: 650.0000 mg | Freq: Four times a day (QID) | RECTAL | Status: DC | PRN

## 2015-11-09 MED ORDER — SODIUM CHLORIDE 0.9 % IV BOLUS (SEPSIS)
500.0000 mL | Freq: Once | INTRAVENOUS | Status: AC
  Administered 2015-11-09: 500 mL via INTRAVENOUS

## 2015-11-09 MED ORDER — DICLOFENAC SODIUM 1 % TD GEL
2.0000 g | Freq: Four times a day (QID) | TRANSDERMAL | Status: DC
  Administered 2015-11-10 (×3): 2 g via TOPICAL
  Filled 2015-11-09 (×2): qty 100

## 2015-11-09 MED ORDER — SODIUM CHLORIDE 0.9 % IJ SOLN
3.0000 mL | Freq: Two times a day (BID) | INTRAMUSCULAR | Status: DC
Start: 2015-11-09 — End: 2015-11-10
  Administered 2015-11-09: 3 mL via INTRAVENOUS

## 2015-11-09 MED ORDER — SODIUM CHLORIDE 0.9 % IV SOLN
INTRAVENOUS | Status: DC
  Administered 2015-11-09: 19:00:00 via INTRAVENOUS

## 2015-11-09 MED ORDER — GABAPENTIN 100 MG PO CAPS
200.0000 mg | ORAL_CAPSULE | Freq: Every day | ORAL | Status: DC
  Administered 2015-11-09: 200 mg via ORAL
  Filled 2015-11-09: qty 2

## 2015-11-09 MED ORDER — ALLOPURINOL 100 MG PO TABS
100.0000 mg | ORAL_TABLET | Freq: Every day | ORAL | Status: DC
  Administered 2015-11-09: 100 mg via ORAL
  Filled 2015-11-09: qty 1

## 2015-11-09 NOTE — ED Notes (Signed)
Attempted report 

## 2015-11-09 NOTE — Progress Notes (Signed)
Pt arrived to unit stable, alert with no noted distress. Pt denies pain or discomfort. Son at bedside able to assist with providing information. Telemetry monitoring. Pt oriented to room. Safety measures in place. Call bell within reach. Will continue to monitor.

## 2015-11-09 NOTE — H&P (Signed)
Date: 11/09/2015               Patient Name:  Charles [name] MRN: [id number]  DOB: September 23, 1918 Age / Sex: 81 y.o., male   PCP: Charles Poll, MD         Medical Service: Internal Medicine Teaching Service         Attending Physician: Dr. Axel Filler, MD    First Contact: Dr. Charlynn [name] Pager: [phone]  Second Contact: Dr. Randell Patient Pager: [phone]       After Hours (After 5p/  First Contact Pager: [phone]  weekends / holidays): Second Contact Pager: [phone]   Chief Complaint: Syncope  History of Present Illness: [name] is a 81 year old male with a past medical history of A. Fib not on anticoagulation, recent CVA in 07/2015, HTN presenting to [facility] ED today following an episode of syncope. The patient does not recall much of the events and the son provided most of the history. The patient was sitting in a chair this morning when he lost consciousness and fell to the floor on his left side. He does not recall the event and denies any lightheadedness, dizziness, chest pain, palpitations or shortness of breath prior to the event. He cannot recall if he was trying to stand up when the event happened or not. He was found by his care giver on the floor unconscious after she heard him hit the floor. He was unconscious for 3-4 minutes. No witnessed seizures, loss of bowel or bladder function or tongue biting. No history of seizures.   He has recently been treated for bronchitis with azithromycin and found to have a UTI with gross hematuria from his suprapubic catheter 6 days ago. Per son's report he had gross hematuria for 1-2 days before it cleared with no further hematuria since. He has previously been on Eliquis for his A. Fib but was stopped in October after discussion with PCP due to frequent nosebleeds. He denies any hematochezia or melena. Denies any NSAID use.   Meds: Current Facility-Administered Medications  Medication Dose Route Frequency Provider Last Rate Last Dose  . acetaminophen  (TYLENOL) tablet 650 mg  650 mg Oral Q6H PRN Charles Loll, MD       Or  . acetaminophen (TYLENOL) suppository 650 mg  650 mg Rectal Q6H PRN Charles Loll, MD      . enoxaparin (LOVENOX) injection 40 mg  40 mg Subcutaneous Q24H Charles Loll, MD      . sodium chloride 0.9 % injection 3 mL  3 mL Intravenous Q12H Charles Loll, MD       Current Outpatient Prescriptions  Medication Sig Dispense Refill  . allopurinol (ZYLOPRIM) 100 MG tablet Take 100 mg by mouth at bedtime.     Marland Kitchen amiodarone (PACERONE) 100 MG tablet Take 50 mg by mouth daily.    Marland Kitchen aspirin EC 81 MG tablet Take 81 mg by mouth daily.    Marland Kitchen atorvastatin (LIPITOR) 40 MG tablet Take 1 tablet (40 mg total) by mouth daily. 30 tablet 0  . azithromycin (ZITHROMAX) 250 MG tablet Take 250 mg by mouth daily.  0  . bisoprolol-hydrochlorothiazide (ZIAC) 10-6.25 MG per tablet Take 1 tablet by mouth daily.    . diclofenac sodium (VOLTAREN) 1 % GEL APPLY 2GRAMS TO NECK & ACROSS SHOULDERS EVERY 6 HOURS WHILE AWAKE.  0  . gabapentin (NEURONTIN) 100 MG capsule Take 200 mg by mouth at bedtime.    Marland Kitchen guaiFENesin (MUCINEX) 600  MG 12 hr tablet Take 600 mg by mouth 2 (two) times daily.      Allergies: Allergies as of 11/09/2015  . (No Known Allergies)   Past Medical History  Diagnosis Date  . Hypertension   . Prostate enlargement   . Stroke [facility])     no residual weakness  . A-fib ([place])   . Hyperlipidemia   . Gout   . Insomnia    Past Surgical History  Procedure Laterality Date  . Suprapubic catheter insertion     Family History  Problem Relation Age of Onset  . Stroke Father    Social History   Social History  . Marital Status: Married    Spouse Name: N/A  . Number of Children: N/A  . Years of Education: N/A   Occupational History  . Not on file.   Social History Main Topics  . Smoking status: Never Smoker   . Smokeless tobacco: Not on file  . Alcohol Use: No  . Drug Use: No  . Sexual Activity: No   Other  Topics Concern  . Not on file   Social History Narrative   Review of Systems: Review of Systems  Constitutional: Negative for fever, chills and malaise/fatigue.  HENT: Negative for nosebleeds.   Eyes: Negative for blurred vision and double vision.  Respiratory: Negative for shortness of breath.   Cardiovascular: Negative for chest pain and palpitations.  Gastrointestinal: Negative for nausea, vomiting, abdominal pain, diarrhea, constipation, blood in stool and melena.  Genitourinary: Positive for hematuria. Negative for dysuria and flank pain.  Musculoskeletal: Positive for falls. Negative for myalgias.  Skin: Negative for rash.  Neurological: Positive for loss of consciousness. Negative for dizziness, focal weakness, seizures, weakness and headaches.  Endo/Heme/Allergies: Negative for polydipsia.  Psychiatric/Behavioral: Negative for depression.   Physical Exam: Blood pressure 172/89, pulse 57, temperature 98.4 F (36.9 C), temperature source Oral, resp. rate 25, height 6\' 1"  (1.854 m), weight 170 lb (77.111 kg), SpO2 95 %. General: alert, well-developed, and cooperative to examination.  Head: normocephalic and atraumatic.  Eyes: vision grossly intact, pupils equal, pupils round, pupils reactive to light, no injection and anicteric.  Mouth: pharynx pink and moist, no erythema, and no exudates.  Neck: supple, full ROM, no thyromegaly, no JVD, and no carotid bruits.  Lungs: normal respiratory effort, no accessory muscle use, decreased breath sounds left lung base, no crackles, and no wheezes. Heart: normal rate, regular rhythm, no murmur, no gallop, and no rub.  Abdomen: soft, non-tender, normal bowel sounds, no distention, no guarding, no rebound tenderness. Suprapubic catheter in place.  Msk: no joint swelling, no joint warmth, and no redness over joints.  Pulses: 2+ DP/PT pulses bilaterally Extremities: No cyanosis, clubbing, edema Neurologic: alert & oriented X3, cranial nerves  II-XII intact, strength normal in all extremities, sensation intact to light touch Skin: turgor normal and no rashes Psych: normal mood and affect  Lab results: Basic Metabolic Panel:  Recent Labs  11/09/15 1203  NA 143  K 3.1*  CL 109  CO2 26  GLUCOSE 91  BUN 23*  CREATININE 1.38*  CALCIUM 8.2*   Liver Function Tests:  Recent Labs  11/09/15 1203  AST 74*  ALT 76*  ALKPHOS 455*  BILITOT 0.9  PROT 5.7*  ALBUMIN 2.6*   No results for input(s): LIPASE, AMYLASE in the last 72 hours. No results for input(s): AMMONIA in the last 72 hours. CBC:  Recent Labs  11/09/15 1203  WBC 5.9  HGB 8.8*  HCT 29.0*  MCV 74.6*  PLT 261   Cardiac Enzymes:  Recent Labs  11/09/15 1203  TROPONINI <0.03   BNP: No results for input(s): PROBNP in the last 72 hours. D-Dimer: No results for input(s): DDIMER in the last 72 hours. CBG: No results for input(s): GLUCAP in the last 72 hours. Hemoglobin A1C: No results for input(s): HGBA1C in the last 72 hours. Fasting Lipid Panel: No results for input(s): CHOL, HDL, LDLCALC, TRIG, CHOLHDL, LDLDIRECT in the last 72 hours. Thyroid Function Tests: No results for input(s): TSH, T4TOTAL, FREET4, T3FREE, THYROIDAB in the last 72 hours. Anemia Panel: No results for input(s): VITAMINB12, FOLATE, FERRITIN, TIBC, IRON, RETICCTPCT in the last 72 hours. Coagulation: No results for input(s): LABPROT, INR in the last 72 hours. Urine Drug Screen: Drugs of Abuse     Component Value Date/Time   LABOPIA NONE DETECTED 07/29/2015 0452   COCAINSCRNUR NONE DETECTED 07/29/2015 0452   LABBENZ NONE DETECTED 07/29/2015 0452   AMPHETMU NONE DETECTED 07/29/2015 0452   THCU NONE DETECTED 07/29/2015 0452   LABBARB NONE DETECTED 07/29/2015 0452    Alcohol Level: No results for input(s): ETH in the last 72 hours. Urinalysis:  Recent Labs  11/09/15 1155  COLORURINE YELLOW  LABSPEC 1.019  PHURINE 7.0  GLUCOSEU NEGATIVE  HGBUR SMALL*  BILIRUBINUR  NEGATIVE  KETONESUR NEGATIVE  PROTEINUR 30*  NITRITE POSITIVE*  LEUKOCYTESUR MODERATE*   Imaging results:  No results found.  Other results: EKG: Sinus rhythm Short PR interval   Assessment & Plan by Problem: Mr. Folker is a 81 year old male with a past medical history of CVA in 07/2015, a. Fib on no anticoagulation, BPH with suprapubic catheter, HTN presents following syncopal episode.   Syncope: Most likely multifactorial. Unclear if patient was at rest vs standing up when event occurred. Does have a history of a.fib on amiodarone, prolonged PR interval on EKG, recent CVA, UTI as well as acute anemia with Hgb drop from 12->8 since September all of which could be playing a role in this event. Patient's son reports his blood pressure usually is 'soft' at home though SBP has been 170-180 here.  -Will hold amiodarone and anti-hypertensives -Start Ceftriaxone for the UTI -Monitor on telemetry -CT head -CMP in am -CBC in am -EKG in am -PT/OT eval  Anemia: Hgb 8.8 on admission from 12.4 07/30/15. Patient denies any melena, hematochezia, NSAID use. Does report gross hematuria from suprapubic catheter for 1-2 days last week. Doubtful the hematuria is the cause of such a dramatic drop in Hgb. FOBT negative. Unclear etiology. MCV 74. No signs of active bleeding.  -Ferritin -Reticulocyte count -CBC in am  UTI: UA with positive nitrite, pyuria and bacteriuria. Reports being told he had a UTI with hematuria last week and has been on course of Azithromycin. Suprapubic catheter replaced tonight in the ED. Normally changed every 4 weeks.  -D/C Azithromycin -Start Ceftriaxone -f/u Urine culture  Hx of recent Bronchitis: Reports being treated for bronchitis with Azithromycin for the past week. CXR done in ED unremarkable.   PAF: On amiodarone. Previously on Eliquis but stopped in October after discussion with PCP as patient did not wish to continue anticoagulation due to frequent nosebleeds.  -D/C  amiodarone -Monitor on telemetry  HTN: On bisoprolol-HCTZ 10-6.25 mg daily at home. SBP has been 160-170s here. Will hold antihypertensives and monitor given syncopal episode.   Hypokalemia: K 3.1 on admission. Replace with KCl 40 mEq. -BMP in am  DVT PPx: Lovenox  CODE: DNR  Dispo: Disposition is deferred at this time, awaiting improvement of current medical problems.  The patient does have a current PCP Charles Poll, MD) and does need an [facility] hospital follow-up appointment after discharge.  The patient does not have transportation limitations that hinder transportation to clinic appointments.  Signed: Maryellen Pile, MD 11/09/2015, 2:52 PM

## 2015-11-09 NOTE — ED Notes (Signed)
Pt off unit with xray 

## 2015-11-09 NOTE — Progress Notes (Signed)
Ceftriaxone for UTI per pharmacy ordered.  Ceftriaxone does not need renal adjustment.  [employer] policy allows pharmacy to change the ordered dose based on indication without contacting the physician, therefore a consult is not needed.   Plan: -ceftriaxone 1g IV q24h -pharmacy to sign off as no adjustment needed.  Elek Holderness D. Ashlynn Gunnels, PharmD, BCPS Clinical Pharmacist Pager: [phone] 11/09/2015 3:04 PM

## 2015-11-09 NOTE — ED Notes (Signed)
Pt comes from [facility] assisted living with a Fall.  Pt caregiver states pt was sitting in chair and fell out and was unresponsive for 3 minutes. Pt fell on his left side on carpet and has a small visible skin tear on his L elbow.  Caregiver denies pt hitting head, pt fell on left shoulder and elbow.  He denies any pain.  Pt has dried blood in right nare and is unsure if he hit his nose.  Pt denies any neck, head or back pain.  GEMS: 16109, 58 pulse,16 resp, 100% CBG 134.  20 G L hand.

## 2015-11-09 NOTE — ED Provider Notes (Signed)
CSN: [id number]     Arrival date & time 11/09/15  1028 History   First MD Initiated Contact with Patient 11/09/15 1029     Chief Complaint  Patient presents with  . Loss of Consciousness  . Knee Pain   (Consider location/radiation/quality/duration/timing/severity/associated sxs/prior Treatment) HPI 81 y.o. male with a hx of Afib and Stroke 07/29/15, presents to the Emergency Department today complaining of loss of consciousness/fall. He is a resident of [facility]. He was sitting at the table this morning until he fell forward due to LOC. According to son, the patient was on the floor for 3 minutes as noted by caregiver at the facility. No seizure noted. No tongue biting, no loss of bowel or bladder function. No head trauma. Landed on L elbow and suffered small abrasion. Was on Eliquis due to previous stroke and Afib, but d/c in October due to frequent nosebleeds. Being treated currently for a UTI since Friday.       Past Medical History  Diagnosis Date  . Hypertension   . Prostate enlargement   . Stroke [facility])     no residual weakness  . A-fib ([place])   . Hyperlipidemia   . Gout   . Insomnia    Past Surgical History  Procedure Laterality Date  . Suprapubic catheter insertion     Family History  Problem Relation Age of Onset  . Stroke Father    Social History  Substance Use Topics  . Smoking status: Never Smoker   . Smokeless tobacco: None  . Alcohol Use: No    Review of Systems  Constitutional: Negative for fever, chills, diaphoresis and fatigue.  HENT: Negative for congestion, sinus pressure, sore throat and tinnitus.   Eyes: Negative for visual disturbance.  Respiratory: Negative for cough and shortness of breath.   Cardiovascular: Negative for chest pain.  Gastrointestinal: Negative for nausea, vomiting, abdominal pain, diarrhea and constipation.  Endocrine: Negative for cold intolerance and heat intolerance.  Musculoskeletal: Negative for  back pain.  Skin: Negative for color change.  Neurological: Positive for syncope. Negative for dizziness, seizures, weakness, numbness and headaches.   Allergies  Review of patient's allergies indicates no known allergies.  Home Medications   Prior to Admission medications   Medication Sig Start Date End Date Taking? Authorizing Provider  acetaminophen (TYLENOL) 325 MG tablet Take 650 mg by mouth every 4 (four) hours as needed.    Historical Provider, MD  allopurinol (ZYLOPRIM) 100 MG tablet Take 100 mg by mouth daily.    Historical Provider, MD  amiodarone (PACERONE) 200 MG tablet Take 100 mg by mouth daily.    Historical Provider, MD  aspirin EC 81 MG tablet Take 81 mg by mouth daily.    Historical Provider, MD  atorvastatin (LIPITOR) 40 MG tablet Take 1 tablet (40 mg total) by mouth daily. 07/31/15   Tasrif Ahmed, MD  bisoprolol-hydrochlorothiazide (ZIAC) 10-6.25 MG per tablet Take 1 tablet by mouth daily.    Historical Provider, MD  diclofenac sodium (VOLTAREN) 1 % GEL APPLY 2GRAMS TO NECK & ACROSS SHOULDERS EVERY 6 HOURS WHILE AWAKE. 08/18/15   Historical Provider, MD  gabapentin (NEURONTIN) 100 MG capsule Take 200 mg by mouth at bedtime.    Historical Provider, MD  Omega-3 Fatty Acids (FISH OIL PO) Take 1 tablet by mouth every evening.    Historical Provider, MD   BP 170/86 mmHg  Pulse 52  Temp(Src) 97.7 F (36.5 C) (Oral)  Resp 19  Ht 6\' 1"  (  1.854 m)  Wt 77.111 kg  BMI 22.43 kg/m2  SpO2 95%   Physical Exam  Constitutional: He is oriented to person, place, and time. He appears well-developed and well-nourished.  HENT:  Head: Normocephalic and atraumatic.  Eyes: Pupils are equal, round, and reactive to light.  Neck: Neck supple.  Cardiovascular: Normal rate and regular rhythm.   Pulmonary/Chest: Effort normal and breath sounds normal.  Abdominal: Soft.  Genitourinary: Rectum normal.     Musculoskeletal: Normal range of motion.       Left shoulder: Normal.       Left  elbow: Normal.       Left hip: Normal.       Arms: Neurological: He is alert and oriented to person, place, and time. He has normal strength. No cranial nerve deficit or sensory deficit.  Skin: Skin is warm and dry.  Psychiatric: He has a normal mood and affect. His behavior is normal. Thought content normal.    ED Course  Procedures (including critical care time) Labs Review Labs Reviewed  CBC - Abnormal; Notable for the following:    RBC 3.89 (*)    Hemoglobin 8.8 (*)    HCT 29.0 (*)    MCV 74.6 (*)    MCH 22.6 (*)    RDW 18.1 (*)    All other components within normal limits  COMPREHENSIVE METABOLIC PANEL - Abnormal; Notable for the following:    Potassium 3.1 (*)    BUN 23 (*)    Creatinine, Ser 1.38 (*)    Calcium 8.2 (*)    Total Protein 5.7 (*)    Albumin 2.6 (*)    AST 74 (*)    ALT 76 (*)    Alkaline Phosphatase 455 (*)    GFR calc non Af Amer 42 (*)    GFR calc Af Amer 48 (*)    All other components within normal limits  URINALYSIS, ROUTINE W REFLEX MICROSCOPIC (NOT AT [facility]) - Abnormal; Notable for the following:    APPearance CLOUDY (*)    Hgb urine dipstick SMALL (*)    Protein, ur 30 (*)    Nitrite POSITIVE (*)    Leukocytes, UA MODERATE (*)    All other components within normal limits  URINE MICROSCOPIC-ADD ON - Abnormal; Notable for the following:    Squamous Epithelial / LPF 0-5 (*)    Bacteria, UA MANY (*)    Crystals TRIPLE PHOSPHATE CRYSTALS (*)    All other components within normal limits  RETICULOCYTES - Abnormal; Notable for the following:    RBC. 3.84 (*)    All other components within normal limits  URINE CULTURE  TROPONIN I  FERRITIN  CBC  COMPREHENSIVE METABOLIC PANEL  CBG MONITORING, ED  POC OCCULT BLOOD, ED   Imaging Review No results found. I have personally reviewed and evaluated these images and lab results as part of my medical decision-making.   EKG Interpretation   Date/Time:  Tuesday November 09 2015 10:33:03  EST Ventricular Rate:  57 PR Interval:  70 QRS Duration: 102 QT Interval:  496 QTC Calculation: 483 R Axis:   75 Text Interpretation:  Sinus rhythm Short PR interval LVH with secondary  repolarization abnormality Borderline prolonged QT interval since last  tracing no significant change Confirmed by MILLER  MD, BRIAN ([id number]) on  11/09/2015 10:38:32 AM      MDM  I have reviewed relevant laboratory values. I have reviewed relevant imaging studies. I have reviewed the relevant EKG. I  have reviewed the relevant previous healthcare records. I obtained HPI from historian. Cases discussed with Attending Physician  ED Course: Guaiac obtained w/ Chaperone present Replaced suprapubic catheter (16 [place]). No complications noted.    Assessment: 57y M with hx stroke and afib presents with LOC. EKG neg. Troponin neg. CMP showed hypokalemia (3.1). Changed suprapubic catheter (26F). UA shows UTI. Pt declined CT. Hgb 12.4 in September to 8.8 Today (11/09/15) Guaiac negative. Hypokalemia treated by giving 40 PO potassium chloride. Meets admission criteria based on [place] Syncope rule.    [place] Syncope Rule C- History of Congestive Heart Failure H- Hematocrit <30% E- Abnormal ECG S- Shortness of Breath S- Triage Systolic Bloods Pressure 0000000  Disposition/Plan:  Admit to Medicine    Patient was discussed with Noemi Chapel, MD    Final diagnoses:  Syncope, unspecified syncope type  Hypokalemia  Anemia, unspecified anemia type  UTI (lower urinary tract infection)      Shary Decamp, PA-C 11/09/15 1815  Shary Decamp, PA-C 11/09/15 1833  Noemi Chapel, MD 11/10/15 2118

## 2015-11-10 DIAGNOSIS — R55 Syncope and collapse: Secondary | ICD-10-CM | POA: Insufficient documentation

## 2015-11-10 DIAGNOSIS — N390 Urinary tract infection, site not specified: Principal | ICD-10-CM | POA: Insufficient documentation

## 2015-11-10 DIAGNOSIS — D649 Anemia, unspecified: Secondary | ICD-10-CM

## 2015-11-10 LAB — COMPREHENSIVE METABOLIC PANEL
ALBUMIN: 2.7 g/dL — AB (ref 3.5–5.0)
ALK PHOS: 439 U/L — AB (ref 38–126)
ALT: 71 U/L — AB (ref 17–63)
AST: 61 U/L — AB (ref 15–41)
Anion gap: 8 (ref 5–15)
BUN: 19 mg/dL (ref 6–20)
CHLORIDE: 109 mmol/L (ref 101–111)
CO2: 24 mmol/L (ref 22–32)
CREATININE: 1.22 mg/dL (ref 0.61–1.24)
Calcium: 8.5 mg/dL — ABNORMAL LOW (ref 8.9–10.3)
GFR calc Af Amer: 56 mL/min — ABNORMAL LOW (ref >60)
GFR calc non Af Amer: 49 mL/min — ABNORMAL LOW (ref >60)
GLUCOSE: 85 mg/dL (ref 65–99)
Potassium: 3.5 mmol/L (ref 3.5–5.1)
SODIUM: 141 mmol/L (ref 135–145)
Total Bilirubin: 0.8 mg/dL (ref 0.3–1.2)
Total Protein: 6.1 g/dL — ABNORMAL LOW (ref 6.5–8.1)

## 2015-11-10 LAB — CBC
HCT: 30.2 % — ABNORMAL LOW (ref 39.0–52.0)
HEMOGLOBIN: 9.2 g/dL — AB (ref 13.0–17.0)
MCH: 22.5 pg — ABNORMAL LOW (ref 26.0–34.0)
MCHC: 30.5 g/dL (ref 30.0–36.0)
MCV: 73.8 fL — ABNORMAL LOW (ref 78.0–100.0)
PLATELETS: 258 10*3/uL (ref 150–400)
RBC: 4.09 MIL/uL — AB (ref 4.22–5.81)
RDW: 18.2 % — ABNORMAL HIGH (ref 11.5–15.5)
WBC: 6.5 10*3/uL (ref 4.0–10.5)

## 2015-11-10 LAB — URINE CULTURE

## 2015-11-10 MED ORDER — HYDROCHLOROTHIAZIDE 12.5 MG PO CAPS
12.5000 mg | ORAL_CAPSULE | Freq: Every day | ORAL | Status: DC
  Administered 2015-11-10: 12.5 mg via ORAL
  Filled 2015-11-10: qty 1

## 2015-11-10 MED ORDER — HYDROCHLOROTHIAZIDE 12.5 MG PO CAPS: 12.5000 mg | ORAL_CAPSULE | Freq: Every day | ORAL | Status: DC

## 2015-11-10 MED ORDER — FERROUS SULFATE 325 (65 FE) MG PO TABS
325.0000 mg | ORAL_TABLET | Freq: Every day | ORAL | Status: DC
  Administered 2015-11-10: 325 mg via ORAL
  Filled 2015-11-10: qty 1

## 2015-11-10 MED ORDER — FERROUS SULFATE 325 (65 FE) MG PO TABS: 325.0000 mg | ORAL_TABLET | Freq: Every day | ORAL | Status: DC

## 2015-11-10 MED ORDER — CEPHALEXIN 500 MG PO CAPS
500.0000 mg | ORAL_CAPSULE | Freq: Two times a day (BID) | ORAL | Status: DC
  Administered 2015-11-10: 500 mg via ORAL
  Filled 2015-11-10: qty 1

## 2015-11-10 MED ORDER — CEPHALEXIN 500 MG PO CAPS: 500.0000 mg | ORAL_CAPSULE | Freq: Two times a day (BID) | ORAL | Status: DC

## 2015-11-10 MED ORDER — SODIUM CHLORIDE 0.9 % IV BOLUS (SEPSIS)
500.0000 mL | Freq: Once | INTRAVENOUS | Status: AC
  Administered 2015-11-10: 500 mL via INTRAVENOUS

## 2015-11-10 NOTE — Progress Notes (Signed)
Discharge instructions reviewed with patient's son. All questions answered at this time. Transport back to disposition per family.   Ave Filter, RN

## 2015-11-10 NOTE — Care Management Note (Signed)
Case Management Note  Patient Details  Name: Charles [name] MRN: [id number] Date of Birth: [birth year]  Subjective/Objective:                    Action/Plan: Patient discharging back to [facility] [employer]. Orders placed for some home health PT. CM spoke with the patients son and he states patient has had PT before through [facility] and would like to use them again. CM attempted to contact Melissa with [facility] but was unable so left a message. Will fax over the order and if they are not able to provide this service the son would like to use [place]. Will follow up with his PT in the am. Bedside RN update.   Expected Discharge Date:                  Expected Discharge Plan:  [place]  In-House Referral:     Discharge planning Services  CM Consult  Post Acute Care Choice:  Home Health Choice offered to:  Adult Children  DME Arranged:    DME Agency:   ([facility] PT program)  HH Arranged:  PT HH Agency:   [facility])  Status of Service:  Completed, signed off  Medicare Important Message Given:    Date Medicare IM Given:    Medicare IM give by:    Date Additional Medicare IM Given:    Additional Medicare Important Message give by:     If discussed at [facility] of Stay Meetings, dates discussed:    Additional Comments:  Pollie Friar, RN 11/10/2015, 4:03 PM

## 2015-11-10 NOTE — Discharge Instructions (Signed)
We have changed some of your medications. Please stop taking the amiodarone and the Ziac (bisoprolol-hydrochlorothiazide). We are also sending you out with a course of Keflex.

## 2015-11-10 NOTE — Progress Notes (Signed)
Subjective: Charles [name] is resting comfortably in bed this morning. He denies any complaints and says he wants to go home. No dizziness, fatigue, chest pain, palpations or shortness of breath. His son reports his wife is at home and does not do well when separated from Charles [name] and they would like to have him home as soon as he is medically stable. They do not wish to have any agressive work up. Reports he has home health PT/OT already set up.   Objective: Vital signs in last 24 hours: Filed Vitals:   11/10/15 0210 11/10/15 0418 11/10/15 0549 11/10/15 0831  BP: 157/74 162/70  184/82  Pulse: 60 54  52  Temp:  99 F (37.2 C)  97.9 F (36.6 C)  TempSrc:  Oral  Oral  Resp: 16 17  16   Height:      Weight:   156 lb 3.2 oz (70.852 kg)   SpO2: 94% 96%  98%   Weight change:   Intake/Output Summary (Last 24 hours) at 11/10/15 1059 Last data filed at 11/10/15 [id number]  Gross per 24 hour  Intake    360 ml  Output    650 ml  Net   -290 ml    PHYSICAL EXAM GENERAL- alert, co-operative, appears as stated age, not in any distress. HEENT- Atraumatic, normocephalic CARDIAC- RRR, no murmurs, rubs or gallops. RESP- Moving equal volumes of air, and clear to auscultation bilaterally, no wheezes or crackles. ABDOMEN- Soft, nontender, bowel sounds present. NEURO- No obvious Cr N abnormality. EXTREMITIES- pulse 2+, symmetric, no pedal edema. SKIN- Warm, dry, No rash or lesion. PSYCH- Normal mood and affect, appropriate thought content and speech.  Medications: I have reviewed the patient's current medications. Scheduled Meds: . allopurinol  100 mg Oral QHS  . aspirin EC  81 mg Oral Daily  . atorvastatin  40 mg Oral Daily  . cephALEXin  500 mg Oral Q12H  . diclofenac sodium  2 g Topical QID  . enoxaparin (LOVENOX) injection  40 mg Subcutaneous Q24H  . ferrous sulfate  325 mg Oral Q breakfast  . gabapentin  200 mg Oral QHS  . hydrochlorothiazide  12.5 mg Oral Daily  . sodium chloride  3 mL  Intravenous Q12H   Continuous Infusions:  PRN Meds:.acetaminophen **OR** acetaminophen Assessment/Plan:  Syncope: Most likely multifactorial. No events overnight. HR has been 50-60. Holding amiodarone and bisoprolol. CT head showed no acute abnormalities. Possible UTI related to his indwelling catheter which we started Ceftriaxone. He also has progressive microcytic anemia likely due to iron deficiency. He remains hypertensive at 150-160s SBP. -Holding amiodarone and bisoprolol -PT/OT eval today -Orthostatic vitals -f/u urine culture -D/C ceftriaxone, switch to Keflex -Start HCTZ 12.5 mg daily   Hypoproliferative Microcytic Anemia: Hgb 8.8 on admission from 12.4 07/30/15. Hgb 9.2 this morning. MCV 74.6, Ferritin 13, with retic count of 1.1%. No signs of active bleeding. Most likely iron deficiency anemia. He does not want an invasive work up. Will start PO Fe. -Ferrous sulfate 325 mg PO daily  UTI: UA with positive nitrite, pyuria and bacteriuria. Suprapubic catheter replaced last night in the ED. Normally changed every 4 weeks.  -d/c Ceftriaxone, start Keflex  -f/u Urine culture  Hx of recent Bronchitis: Reports being treated for bronchitis with Azithromycin for the past week. CXR done in ED unremarkable.   PAF: On amiodarone. Previously on Eliquis but stopped in October after discussion with PCP as patient did not wish to continue anticoagulation due to frequent nosebleeds.  -  holding amiodarone and bisoprolol  -Monitor on telemetry  HTN: On bisoprolol-HCTZ 10-6.25 mg daily at home. SBP has been 150-160s here.  -Will stop HCTZ 12.5 mg daily   Hypokalemia: Resolved. K 3.5 this morning.   DVT PPx: Lovenox  CODE: DNR  Dispo: Likely discharge today  The patient does have a current PCP Reymundo Poll, MD) and does need an [facility] hospital follow-up appointment after discharge.  The patient does not have transportation limitations that hinder transportation to clinic  appointments.  .Services Needed at time of discharge: Y = Yes, Blank = No PT:   OT:   RN:   Equipment:   Other:     LOS: 1 day   Maryellen Pile, MD IMTS PGY-1 [phone] 11/10/2015, 10:59 AM

## 2015-11-10 NOTE — Progress Notes (Signed)
Patient with discharge order. Patient is now dressed and awaiting fro family to arrive at this time to reviewed discharge instruction.   Ave Filter, RN

## 2015-11-10 NOTE — Evaluation (Signed)
Physical Therapy Evaluation Patient Details Name: Charles [name] MRN: [id number] DOB: 08/02/1918 Today's Date: 11/10/2015   History of Present Illness  Pt admitted after syncopal episode with fall while at his ALF.  PMH: CVA 08/06/15, afib, HTN  Clinical Impression  Patient presents likely close to functional baseline, but no caregiver to determine baseline so would recommend HHPT upon d/c to maximize safety and independence with mobility.  No further skilled acute PT needs as d/c likely later today.    Follow Up Recommendations Home health PT    Equipment Recommendations  None recommended by PT    Recommendations for Other Services       Precautions / Restrictions Precautions Precautions: Fall Restrictions Weight Bearing Restrictions: No Other Position/Activity Restrictions: suprapubic catheter      Mobility  Bed Mobility Overal bed mobility: Needs Assistance Bed Mobility: Supine to Sit     Supine to sit: Supervision;HOB elevated     General bed mobility comments: no rail and no assist, supervision for safety  Transfers Overall transfer level: Needs assistance Equipment used: Rolling walker (2 wheeled) Transfers: Sit to/from Stand Sit to Stand: Supervision         General transfer comment: stood up without assist  Ambulation/Gait Ambulation/Gait assistance: Min guard Ambulation Distance (Feet): 200 Feet Assistive device: Rolling walker (2 wheeled) Gait Pattern/deviations: Step-through pattern;Trunk flexed;Decreased stride length;Shuffle     General Gait Details: demonstrates wide turns with assist for safety (uses four wheeled walker at home)  Stairs            Wheelchair Mobility    Modified Rankin (Stroke Patients Only)       Balance Overall balance assessment: Needs assistance         Standing balance support: Bilateral upper extremity supported Standing balance-Leahy Scale: Poor Standing balance comment: UE support for balance                             Pertinent Vitals/Pain Pain Assessment: No/denies pain Faces Pain Scale: Hurts a little bit Pain Location: L knee Pain Descriptors / Indicators: Sore;Grimacing    Home Living Family/patient expects to be discharged to:: Assisted living               Home Equipment: Walker - 4 wheels      Prior Function           Comments: Per pt he pushed a w/c with supervision to dining room and ambulated with a RW in his room/bathroom. States he could bathe and dress himself.  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     Hand Dominance   Dominant Hand: Right    Extremity/Trunk Assessment   Upper Extremity Assessment: Overall WFL for tasks assessed           Lower Extremity Assessment: Overall WFL for tasks assessed         Communication   Communication: HOH  Cognition Arousal/Alertness: Awake/alert Behavior During Therapy: WFL for tasks assessed/performed Overall Cognitive Status: No family/caregiver present to determine baseline cognitive functioning                      General Comments      Exercises        Assessment/Plan    PT Assessment All further PT needs can be met in the next venue of care  PT Diagnosis Abnormality of gait   PT Problem List Decreased balance;Decreased mobility;Decreased strength;Decreased safety awareness  PT  Treatment Interventions     PT Goals (Current goals can be found in the Care Plan section) Acute Rehab PT Goals Patient Stated Goal: go home asap Time For Goal Achievement: 11/17/15 Potential to Achieve Goals: Good    Frequency     Barriers to discharge        Co-evaluation               End of Session Equipment Utilized During Treatment: Gait belt Activity Tolerance: Patient tolerated treatment well Patient left: in bed;with call bell/phone within reach;with bed alarm set;with nursing/sitter in room      Functional Assessment Tool Used: Clinical Observation Functional  Limitation: Mobility: Walking and moving around Mobility: Walking and Moving Around Current Status (B7127): At least 20 percent but less than 40 percent impaired, limited or restricted Mobility: Walking and Moving Around Goal Status [phone]): At least 20 percent but less than 40 percent impaired, limited or restricted Mobility: Walking and Moving Around Discharge Status [phone]): At least 20 percent but less than 40 percent impaired, limited or restricted    Time: 0016-4290 PT Time Calculation (min) (ACUTE ONLY): 25 min   Charges:   PT Evaluation $Initial PT Evaluation Tier I: 1 Procedure PT Treatments $Gait Training: 8-22 mins   PT G Codes:   PT G-Codes **NOT FOR INPATIENT CLASS** Functional Assessment Tool Used: Clinical Observation Functional Limitation: Mobility: Walking and moving around Mobility: Walking and Moving Around Current Status (P7955): At least 20 percent but less than 40 percent impaired, limited or restricted Mobility: Walking and Moving Around Goal Status [phone]): At least 20 percent but less than 40 percent impaired, limited or restricted Mobility: Walking and Moving Around Discharge Status [phone]): At least 20 percent but less than 40 percent impaired, limited or restricted    Reginia Naas 11/10/2015, 2:30 PM  Magda Kiel, [place] 11/10/2015

## 2015-11-10 NOTE — Evaluation (Signed)
Occupational Therapy Evaluation Patient Details Name: Charles [name] MRN: [id number] DOB: January 23, 1918 Today's Date: 11/10/2015    History of Present Illness Pt admitted after syncopal episode with fall while at his ALF.  PMH: CVA 08/06/15, afib, HTN   Clinical Impression   Pt ambulated with a walker or pushed a manual w/c with supervision to the dining area of his ALF and he reports being able to bathe and dress himself.  No family available to confirm. Pt presents with impaired cognition, generalized weakness and decreased balance interfering with ability to perform at his baseline.  Pt is eager to return back to the ALF to be with his wife of 30 years.     Follow Up Recommendations  Home health OT;Supervision/Assistance - 24 hour (at ALF)    Equipment Recommendations  None recommended by OT    Recommendations for Other Services       Precautions / Restrictions Precautions Precautions: Fall Restrictions Weight Bearing Restrictions: No Other Position/Activity Restrictions: suprapubic catheter      Mobility Bed Mobility Overal bed mobility: Needs Assistance Bed Mobility: Supine to Sit     Supine to sit: Supervision;HOB elevated     General bed mobility comments: use of rail, no physical assist  Transfers Overall transfer level: Needs assistance Equipment used: Rolling walker (2 wheeled) Transfers: Sit to/from Stand Sit to Stand: Min assist              Balance                                            ADL Overall ADL's : Needs assistance/impaired Eating/Feeding: Set up;Sitting   Grooming: Wash/dry hands;Wash/dry face;Sitting;Set up           Upper Body Dressing : Set up;Sitting   Lower Body Dressing: Moderate assistance;Sit to/from stand   Toilet Transfer: Minimal assistance;RW;Ambulation;Regular Toilet   Toileting- Clothing Manipulation and Hygiene: Minimal assistance;Sit to/from stand       Functional mobility during ADLs:  Minimal assistance;Rolling walker       Vision     Perception     Praxis      Pertinent Vitals/Pain Pain Assessment: Faces Faces Pain Scale: Hurts a little bit Pain Location: L knee Pain Descriptors / Indicators: Sore;Grimacing     Hand Dominance Right   Extremity/Trunk Assessment Upper Extremity Assessment Upper Extremity Assessment: Overall WFL for tasks assessed   Lower Extremity Assessment Lower Extremity Assessment: Defer to PT evaluation       Communication Communication Communication: HOH   Cognition Arousal/Alertness: Awake/alert Behavior During Therapy: WFL for tasks assessed/performed Overall Cognitive Status: No family/caregiver present to determine baseline cognitive functioning                     General Comments       Exercises       Shoulder Instructions      Home Living Family/patient expects to be discharged to:: Assisted living                             Home Equipment: Walker - 4 wheels          Prior Functioning/Environment          Comments: Per pt he pushed a w/c with supervision to dining room and ambulated with a RW in his room/bathroom.  States he could bathe and dress himself.  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    OT Diagnosis: Generalized weakness;Cognitive deficits;Acute pain   OT Problem List: Decreased strength;Decreased activity tolerance;Impaired balance (sitting and/or standing);Decreased cognition;Decreased safety awareness;Pain   OT Treatment/Interventions: Self-care/ADL training;DME and/or AE instruction;Patient/family education    OT Goals(Current goals can be found in the care plan section) Acute Rehab OT Goals Patient Stated Goal: go home this afternoon OT Goal Formulation: With patient Time For Goal Achievement: 11/17/15 Potential to Achieve Goals: Good ADL Goals Pt Will Perform Grooming: with supervision;standing Pt Will Perform Upper Body Bathing: with supervision;sitting Pt Will  Perform Lower Body Bathing: with min assist;sit to/from stand Pt Will Transfer to Toilet: with supervision;ambulating;bedside commode (over toilet) Pt Will Perform Toileting - Clothing Manipulation and hygiene: with supervision;sit to/from stand  OT Frequency: Min 2X/week   Barriers to D/C:            Co-evaluation              End of Session Equipment Utilized During Treatment: Gait belt;Rolling walker  Activity Tolerance: Patient tolerated treatment well Patient left: in chair;with call bell/phone within reach;with nursing/sitter in room   Time: 1150-1223 OT Time Calculation (min): 33 min Charges:  OT General Charges $OT Visit: 1 Procedure OT Evaluation $Initial OT Evaluation Tier I: 1 Procedure OT Treatments $Self Care/Home Management : 8-22 mins G-Codes:    Malka So 11/10/2015, 12:25 PM  [phone]

## 2015-11-10 NOTE — H&P (Deleted)
Name: Charles [name] MRN: [id number] DOB: [birth year] 81 y.o. PCP: Charles Poll, MD  Date of Admission: 11/09/2015 10:28 AM Date of Discharge: 11/10/2015 Attending Physician: Axel Filler, MD  Discharge Diagnosis: Active Problems:   Syncope   Faintness   Absolute anemia   UTI (lower urinary tract infection)   Anemia, unspecified   Syncope and collapse   Urinary tract infection, site not specified  Discharge Medications:   Medication List    STOP taking these medications        amiodarone 100 MG tablet  Commonly known as:  PACERONE     bisoprolol-hydrochlorothiazide 10-6.25 MG tablet  Commonly known as:  ZIAC      TAKE these medications        allopurinol 100 MG tablet  Commonly known as:  ZYLOPRIM  Take 100 mg by mouth at bedtime.     aspirin EC 81 MG tablet  Take 81 mg by mouth daily.     atorvastatin 40 MG tablet  Commonly known as:  LIPITOR  Take 1 tablet (40 mg total) by mouth daily.     cephALEXin 500 MG capsule  Commonly known as:  KEFLEX  Take 1 capsule (500 mg total) by mouth every 12 (twelve) hours.     diclofenac sodium 1 % Gel  Commonly known as:  VOLTAREN  APPLY 2GRAMS TO NECK & ACROSS SHOULDERS EVERY 6 HOURS WHILE AWAKE.     ferrous sulfate 325 (65 FE) MG tablet  Take 1 tablet (325 mg total) by mouth daily with breakfast.     gabapentin 100 MG capsule  Commonly known as:  NEURONTIN  Take 200 mg by mouth at bedtime.     guaiFENesin 600 MG 12 hr tablet  Commonly known as:  MUCINEX  Take 600 mg by mouth 2 (two) times daily.     hydrochlorothiazide 12.5 MG capsule  Commonly known as:  MICROZIDE  Take 1 capsule (12.5 mg total) by mouth daily.        Disposition and follow-up:   Charles [name] was discharged from [facility] in Stable condition.  At the hospital follow up visit please address:  1.Syncope: Likely multifactorial with medication effect, bradycardia, UTI, and worsening anemia. Stopped his amiodarone  and bisoprolol. Given Rx for HCTZ 12.5 mg daily.   UTI: Given 7 day course of Keflex 500 mg bid. To finish course on 12/26.   Hypoproliferation Microcytic Anemia: Hgb 8.8 on admission, down from 12.4 on 07/30/15. Unclear etiology. MCV 74. Ferritin 13 with retic count of 1.1%. Most likely iron deficiency anemia. Started on Ferrous Sulfate 325 mg daily. Declined any invasive work up. Will need to follow H/H to monitor.   2.  Labs / imaging needed at time of follow-up: CBC  3.  Pending labs/ test needing follow-up: None  Follow-up Appointments:     Follow-up Information    Schedule an appointment as soon as possible for a visit with Charles Poll, MD.   Specialty:  Family Medicine   Contact information:   [address] [place] [address] [phone]       Discharge Instructions: Discharge Instructions    Diet - low sodium heart healthy    Complete by:  As directed      Increase activity slowly    Complete by:  As directed            Consultations:    Procedures Performed:  Dg Chest 2 View  11/09/2015  CLINICAL DATA:  Status post fall out of a chair today with an episode of unresponsiveness lasting 3 minutes. Initial encounter. EXAM: CHEST  2 VIEW COMPARISON:  PA and lateral chest 07/25/2015. FINDINGS: The patient is rotated on the study. There is cardiomegaly without edema. No consolidative process, pneumothorax or effusion is identified. No focal bony abnormality. IMPRESSION: Cardiomegaly without acute disease. Electronically Signed   By: Inge Rise M.D.   On: 11/09/2015 15:13   Ct Head Wo Contrast  11/09/2015  CLINICAL DATA:  81 year old male with syncope and fall. EXAM: CT HEAD WITHOUT CONTRAST TECHNIQUE: Contiguous axial images were obtained from the base of the skull through the vertex without intravenous contrast. COMPARISON:  CT dated 07/1915 FINDINGS: The ventricles are dilated and the sulci are prominent compatible with age-related atrophy.  Periventricular and deep white matter hypodensities represent chronic microvascular ischemic changes. Focal left frontal old infarct and encephalomalacia. Small right basal ganglia and left thalamic old lacunar infarcts noted. There is no intracranial hemorrhage. No mass effect or midline shift identified. The visualized paranasal sinuses and mastoid air cells are well aerated. The calvarium is intact. Old left zygomatic bone fracture. IMPRESSION: No acute intracranial hemorrhage. Age-related atrophy and chronic microvascular ischemic disease. Electronically Signed   By: Anner Crete M.D.   On: 11/09/2015 20:20   Admission HPI: Charles [name] is a 81 year old male with a past medical history of A. Fib not on anticoagulation, recent CVA in 07/2015, HTN presenting to [facility] ED today following an episode of syncope. The patient does not recall much of the events and the son provided most of the history. The patient was sitting in a chair this morning when he lost consciousness and fell to the floor on his left side. He does not recall the event and denies any lightheadedness, dizziness, chest pain, palpitations or shortness of breath prior to the event. He cannot recall if he was trying to stand up when the event happened or not. He was found by his care giver on the floor unconscious after she heard him hit the floor. He was unconscious for 3-4 minutes. No witnessed seizures, loss of bowel or bladder function or tongue biting. No history of seizures.   He has recently been treated for bronchitis with azithromycin and found to have a UTI with gross hematuria from his suprapubic catheter 6 days ago. Per son's report he had gross hematuria for 1-2 days before it cleared with no further hematuria since. He has previously been on Eliquis for his A. Fib but was stopped in October after discussion with PCP due to frequent nosebleeds. He denies any hematochezia or melena. Denies any NSAID use.   Hospital Course by problem  list:  Syncope: Most likely multifactorial. Unclear if patient was at rest vs standing up when event occurred. Does have a history of a.fib on amiodarone, prolonged PR interval on EKG with bradycardia, recent CVA, UTI as well as acute anemia with Hgb drop from 12->8 since September all of which could be playing a role in this event. Patient's son reports his blood pressure usually is 'soft' at home though SBP was 170-180 here in the ED. CT head was negative for any acute abnormality. We stopped his amiodarone and bisoprolol and started him on Ceftriaxone for UTI. No events on telemetry. Hgb was stable. He was initially orthostatic but improved following IVF. No further episodes during hospitalization. He does not wish for any extensive work up and wishes to go home. Will discharge  with medication changes as above to follow up with PCP.   Hypoproliferation Microcytic Anemia: Hgb 8.8 on admission, dow from 12.4 on 07/30/15. Patient denied any melena, hematochezia, NSAID use. Did report gross hematuria from suprapubic catheter for 1-2 days last week. Doubtful the hematuria is the cause of such a dramatic drop in Hgb. FOBT negative. Unclear etiology. MCV 74. No signs of active bleeding. Ferritin 13 with retic count of 1.1%. Most likely iron deficiency anemia. Started on Ferrous Sulfate 325 mg daily. Declined any invasive work up.   UTI: UA with positive nitrite, pyuria and bacteriuria. Reports being told he had a UTI with hematuria last week and has been on course of Azithromycin. Suprapubic catheter replaced in the ED. Normally changed every 4 weeks. Started on Ceftriaxone. Urine culture growing multiple species. Transitioned to Kelfex 500 mg bid empirically to finish 7 day course.   Hx of recent Bronchitis: Reports being treated for bronchitis with Azithromycin for the past week. CXR done in ED unremarkable. Stopped Azithromycin.   PAF: Patient on amiodarone and bisoprolol at home. Previously on Eliquis but  stopped in October after discussion with PCP as patient did not wish to continue anticoagulation due to frequent nosebleeds. Stopped amiodarone and bisoprolol per above.  HTN: On bisoprolol-HCTZ 10-6.25 mg daily at home. SBP has been 160-170s here. Held antihypertensives given syncopal episode. Stopped bisoprolol per above. Given Rx for HCTZ 12.5 mg daily.   Hypokalemia: K 3.1 on admission. Replace with KCl 40 mEq. K 3.5 at discharge.   Discharge Vitals:   BP 155/72 mmHg  Pulse 52  Temp(Src) 97.9 F (36.6 C) (Oral)  Resp 16  Ht 6\' 1"  (1.854 m)  Wt 156 lb 3.2 oz (70.852 kg)  BMI 20.61 kg/m2  SpO2 98%  Discharge Labs:  Results for orders placed or performed during the hospital encounter of 11/09/15 (from the past 24 hour(s))  Reticulocytes     Status: Abnormal   Collection Time: 11/09/15  5:25 PM  Result Value Ref Range   Retic Ct Pct 1.1 0.4 - 3.1 %   RBC. 3.84 (L) 4.22 - 5.81 MIL/uL   Retic Count, Manual 42.2 19.0 - 186.0 K/uL  Ferritin     Status: Abnormal   Collection Time: 11/09/15  5:25 PM  Result Value Ref Range   Ferritin 13 (L) 24 - 336 ng/mL  CBC     Status: Abnormal   Collection Time: 11/10/15  6:36 AM  Result Value Ref Range   WBC 6.5 4.0 - 10.5 K/uL   RBC 4.09 (L) 4.22 - 5.81 MIL/uL   Hemoglobin 9.2 (L) 13.0 - 17.0 g/dL   HCT 30.2 (L) 39.0 - 52.0 %   MCV 73.8 (L) 78.0 - 100.0 fL   MCH 22.5 (L) 26.0 - 34.0 pg   MCHC 30.5 30.0 - 36.0 g/dL   RDW 18.2 (H) 11.5 - 15.5 %   Platelets 258 150 - 400 K/uL  Comprehensive metabolic panel     Status: Abnormal   Collection Time: 11/10/15  6:36 AM  Result Value Ref Range   Sodium 141 135 - 145 mmol/L   Potassium 3.5 3.5 - 5.1 mmol/L   Chloride 109 101 - 111 mmol/L   CO2 24 22 - 32 mmol/L   Glucose, Bld 85 65 - 99 mg/dL   BUN 19 6 - 20 mg/dL   Creatinine, Ser 1.22 0.61 - 1.24 mg/dL   Calcium 8.5 (L) 8.9 - 10.3 mg/dL   Total Protein 6.1 (L) 6.5 -  8.1 g/dL   Albumin 2.7 (L) 3.5 - 5.0 g/dL   AST 61 (H) 15 - 41 U/L   ALT  71 (H) 17 - 63 U/L   Alkaline Phosphatase 439 (H) 38 - 126 U/L   Total Bilirubin 0.8 0.3 - 1.2 mg/dL   GFR calc non Af Amer 49 (L) >60 mL/min   GFR calc Af Amer 56 (L) >60 mL/min   Anion gap 8 5 - 15    Signed: Maryellen Pile, MD 11/10/2015, 3:26 PM    Services Ordered on Discharge: Home health PT

## 2015-11-11 NOTE — Discharge Summary (Signed)
Name: Charles [name] MRN: [id number] DOB: [birth year] 81 y.o. PCP: Reymundo Poll, MD  Date of Admission: 11/09/2015 10:28 AM Date of Discharge: 11/10/2015 Attending Physician: Axel Filler, MD  Discharge Diagnosis: Active Problems:   Syncope   Faintness   Absolute anemia   UTI (lower urinary tract infection)   Anemia, unspecified   Syncope and collapse   Urinary tract infection, site not specified  Discharge Medications:   Medication List    STOP taking these medications        amiodarone 100 MG tablet  Commonly known as:  PACERONE     bisoprolol-hydrochlorothiazide 10-6.25 MG tablet  Commonly known as:  ZIAC      TAKE these medications        allopurinol 100 MG tablet  Commonly known as:  ZYLOPRIM  Take 100 mg by mouth at bedtime.     aspirin EC 81 MG tablet  Take 81 mg by mouth daily.     atorvastatin 40 MG tablet  Commonly known as:  LIPITOR  Take 1 tablet (40 mg total) by mouth daily.     cephALEXin 500 MG capsule  Commonly known as:  KEFLEX  Take 1 capsule (500 mg total) by mouth every 12 (twelve) hours.     diclofenac sodium 1 % Gel  Commonly known as:  VOLTAREN  APPLY 2GRAMS TO NECK & ACROSS SHOULDERS EVERY 6 HOURS WHILE AWAKE.     ferrous sulfate 325 (65 FE) MG tablet  Take 1 tablet (325 mg total) by mouth daily with breakfast.     gabapentin 100 MG capsule  Commonly known as:  NEURONTIN  Take 200 mg by mouth at bedtime.     guaiFENesin 600 MG 12 hr tablet  Commonly known as:  MUCINEX  Take 600 mg by mouth 2 (two) times daily.     hydrochlorothiazide 12.5 MG capsule  Commonly known as:  MICROZIDE  Take 1 capsule (12.5 mg total) by mouth daily.        Disposition and follow-up:   Mr.Charles [name] was discharged from [facility] in Stable condition.  At the hospital follow up visit please address:  1.Syncope: Likely multifactorial with medication effect, bradycardia, UTI, and worsening anemia. Stopped his amiodarone  and bisoprolol. Given Rx for HCTZ 12.5 mg daily.   UTI: Given 7 day course of Keflex 500 mg bid. To finish course on 12/26.   Hypoproliferation Microcytic Anemia: Hgb 8.8 on admission, down from 12.4 on 07/30/15. Unclear etiology. MCV 74. Ferritin 13 with retic count of 1.1%. Most likely iron deficiency anemia. Started on Ferrous Sulfate 325 mg daily. Declined any invasive work up. Will need to follow H/H to monitor.   2.  Labs / imaging needed at time of follow-up: CBC  3.  Pending labs/ test needing follow-up: None  Follow-up Appointments: Follow-up Information    Schedule an appointment as soon as possible for a visit with Reymundo Poll, MD.   Specialty:  Family Medicine   Contact information:   [address] [place] [address] [phone]       Discharge Instructions: Discharge Instructions    Diet - low sodium heart healthy    Complete by:  As directed      Increase activity slowly    Complete by:  As directed            Consultations:    Procedures Performed:  Dg Chest 2 View  11/09/2015  CLINICAL DATA:  Status  post fall out of a chair today with an episode of unresponsiveness lasting 3 minutes. Initial encounter. EXAM: CHEST  2 VIEW COMPARISON:  PA and lateral chest 07/25/2015. FINDINGS: The patient is rotated on the study. There is cardiomegaly without edema. No consolidative process, pneumothorax or effusion is identified. No focal bony abnormality. IMPRESSION: Cardiomegaly without acute disease. Electronically Signed   By: Inge Rise M.D.   On: 11/09/2015 15:13   Ct Head Wo Contrast  11/09/2015  CLINICAL DATA:  81 year old male with syncope and fall. EXAM: CT HEAD WITHOUT CONTRAST TECHNIQUE: Contiguous axial images were obtained from the base of the skull through the vertex without intravenous contrast. COMPARISON:  CT dated 07/1915 FINDINGS: The ventricles are dilated and the sulci are prominent compatible with age-related atrophy.  Periventricular and deep white matter hypodensities represent chronic microvascular ischemic changes. Focal left frontal old infarct and encephalomalacia. Small right basal ganglia and left thalamic old lacunar infarcts noted. There is no intracranial hemorrhage. No mass effect or midline shift identified. The visualized paranasal sinuses and mastoid air cells are well aerated. The calvarium is intact. Old left zygomatic bone fracture. IMPRESSION: No acute intracranial hemorrhage. Age-related atrophy and chronic microvascular ischemic disease. Electronically Signed   By: Anner Crete M.D.   On: 11/09/2015 20:20   Admission HPI: [name] is a 81 year old male with a past medical history of A. Fib not on anticoagulation, recent CVA in 07/2015, HTN presenting to [facility] ED today following an episode of syncope. The patient does not recall much of the events and the son provided most of the history. The patient was sitting in a chair this morning when he lost consciousness and fell to the floor on his left side. He does not recall the event and denies any lightheadedness, dizziness, chest pain, palpitations or shortness of breath prior to the event. He cannot recall if he was trying to stand up when the event happened or not. He was found by his care giver on the floor unconscious after she heard him hit the floor. He was unconscious for 3-4 minutes. No witnessed seizures, loss of bowel or bladder function or tongue biting. No history of seizures.   He has recently been treated for bronchitis with azithromycin and found to have a UTI with gross hematuria from his suprapubic catheter 6 days ago. Per son's report he had gross hematuria for 1-2 days before it cleared with no further hematuria since. He has previously been on Eliquis for his A. Fib but was stopped in October after discussion with PCP due to frequent nosebleeds. He denies any hematochezia or melena. Denies any NSAID use.   Hospital Course by problem  list:  Syncope: Most likely multifactorial. Unclear if patient was at rest vs standing up when event occurred. Does have a history of a.fib on amiodarone, prolonged PR interval on EKG with bradycardia, recent CVA, UTI as well as acute anemia with Hgb drop from 12->8 since September all of which could be playing a role in this event. Patient's son reports his blood pressure usually is 'soft' at home though SBP was 170-180 here in the ED. CT head was negative for any acute abnormality. We stopped his amiodarone and bisoprolol and started him on Ceftriaxone for UTI. No events on telemetry. Hgb was stable. He was initially orthostatic but improved following IVF. No further episodes during hospitalization. He does not wish for any extensive work up and wishes to go home. Will discharge with medication changes as  above to follow up with PCP.   Hypoproliferation Microcytic Anemia: Hgb 8.8 on admission, dow from 12.4 on 07/30/15. Patient denied any melena, hematochezia, NSAID use. Did report gross hematuria from suprapubic catheter for 1-2 days last week. Doubtful the hematuria is the cause of such a dramatic drop in Hgb. FOBT negative. Unclear etiology. MCV 74. No signs of active bleeding. Ferritin 13 with retic count of 1.1%. Most likely iron deficiency anemia. Started on Ferrous Sulfate 325 mg daily. Declined any invasive work up.   UTI: UA with positive nitrite, pyuria and bacteriuria. Reports being told he had a UTI with hematuria last week and has been on course of Azithromycin. Suprapubic catheter replaced in the ED. Normally changed every 4 weeks. Started on Ceftriaxone. Urine culture growing multiple species. Transitioned to Kelfex 500 mg bid empirically to finish 7 day course.   Hx of recent Bronchitis: Reports being treated for bronchitis with Azithromycin for the past week. CXR done in ED unremarkable. Stopped Azithromycin.   PAF: Patient on amiodarone and bisoprolol at home. Previously on Eliquis but  stopped in October after discussion with PCP as patient did not wish to continue anticoagulation due to frequent nosebleeds. Stopped amiodarone and bisoprolol per above.  HTN: On bisoprolol-HCTZ 10-6.25 mg daily at home. SBP has been 160-170s here. Held antihypertensives given syncopal episode. Stopped bisoprolol per above. Given Rx for HCTZ 12.5 mg daily.   Hypokalemia: K 3.1 on admission. Replace with KCl 40 mEq. K 3.5 at discharge.   Discharge Vitals:   BP 155/72 mmHg  Pulse 52  Temp(Src) 97.9 F (36.6 C) (Oral)  Resp 16  Ht 6\' 1"  (1.854 m)  Wt 156 lb 3.2 oz (70.852 kg)  BMI 20.61 kg/m2  SpO2 98%  Discharge Labs:  No results found for this or any previous visit (from the past 24 hour(s)).  Signed: Maryellen Pile, MD 11/11/2015, 10:36 AM    Services Ordered on Discharge: Home health PT

## 2015-11-11 NOTE — Progress Notes (Signed)
11/11/15 @ 0816--CM spoke with Melissa from [facility] and they are going to provide his PT through [facility] at the facility. Information faxed to [facility] last night.

## 2015-11-29 ENCOUNTER — Emergency Department (HOSPITAL_COMMUNITY)
Admission: EM | Admit: 2015-11-29 | Discharge: 2015-11-29 | Disposition: A | Discharge status: Home / Self Care | Payer: PPO | Attending: Emergency Medicine | Admitting: Emergency Medicine

## 2015-11-29 ENCOUNTER — Encounter (HOSPITAL_COMMUNITY): Payer: Self-pay | Admitting: Emergency Medicine

## 2015-11-29 DIAGNOSIS — I10 Essential (primary) hypertension: Secondary | ICD-10-CM | POA: Insufficient documentation

## 2015-11-29 DIAGNOSIS — M109 Gout, unspecified: Secondary | ICD-10-CM | POA: Insufficient documentation

## 2015-11-29 DIAGNOSIS — Z791 Long term (current) use of non-steroidal anti-inflammatories (NSAID): Secondary | ICD-10-CM | POA: Diagnosis not present

## 2015-11-29 DIAGNOSIS — T83038A Leakage of other indwelling urethral catheter, initial encounter: Secondary | ICD-10-CM

## 2015-11-29 DIAGNOSIS — E785 Hyperlipidemia, unspecified: Secondary | ICD-10-CM | POA: Insufficient documentation

## 2015-11-29 DIAGNOSIS — Y658 Other specified misadventures during surgical and medical care: Secondary | ICD-10-CM | POA: Diagnosis not present

## 2015-11-29 DIAGNOSIS — Z8673 Personal history of transient ischemic attack (TIA), and cerebral infarction without residual deficits: Secondary | ICD-10-CM | POA: Insufficient documentation

## 2015-11-29 DIAGNOSIS — Z87438 Personal history of other diseases of male genital organs: Secondary | ICD-10-CM | POA: Diagnosis not present

## 2015-11-29 DIAGNOSIS — Z79899 Other long term (current) drug therapy: Secondary | ICD-10-CM | POA: Insufficient documentation

## 2015-11-29 DIAGNOSIS — Z7982 Long term (current) use of aspirin: Secondary | ICD-10-CM | POA: Diagnosis not present

## 2015-11-29 DIAGNOSIS — Z9889 Other specified postprocedural states: Secondary | ICD-10-CM | POA: Diagnosis not present

## 2015-11-29 DIAGNOSIS — E860 Dehydration: Secondary | ICD-10-CM | POA: Insufficient documentation

## 2015-11-29 DIAGNOSIS — G4700 Insomnia, unspecified: Secondary | ICD-10-CM | POA: Diagnosis not present

## 2015-11-29 DIAGNOSIS — R531 Weakness: Secondary | ICD-10-CM | POA: Insufficient documentation

## 2015-11-29 LAB — CBC WITH DIFFERENTIAL/PLATELET
BASOS PCT: 0 %
Basophils Absolute: 0 10*3/uL (ref 0.0–0.1)
EOS PCT: 2 %
Eosinophils Absolute: 0.2 10*3/uL (ref 0.0–0.7)
HEMATOCRIT: 36 % — AB (ref 39.0–52.0)
Hemoglobin: 10.9 g/dL — ABNORMAL LOW (ref 13.0–17.0)
Lymphocytes Relative: 9 %
Lymphs Abs: 0.8 10*3/uL (ref 0.7–4.0)
MCH: 23.7 pg — ABNORMAL LOW (ref 26.0–34.0)
MCHC: 30.3 g/dL (ref 30.0–36.0)
MCV: 78.3 fL (ref 78.0–100.0)
MONO ABS: 0.5 10*3/uL (ref 0.1–1.0)
Monocytes Relative: 6 %
NEUTROS PCT: 83 %
Neutro Abs: 7.3 10*3/uL (ref 1.7–7.7)
Platelets: 187 10*3/uL (ref 150–400)
RBC: 4.6 MIL/uL (ref 4.22–5.81)
RDW: 22.7 % — AB (ref 11.5–15.5)
WBC: 8.8 10*3/uL (ref 4.0–10.5)

## 2015-11-29 LAB — URINALYSIS, ROUTINE W REFLEX MICROSCOPIC
Bilirubin Urine: NEGATIVE
Glucose, UA: NEGATIVE mg/dL
Ketones, ur: NEGATIVE mg/dL
NITRITE: NEGATIVE
Specific Gravity, Urine: 1.013 (ref 1.005–1.030)
pH: 8.5 — ABNORMAL HIGH (ref 5.0–8.0)

## 2015-11-29 LAB — URINE MICROSCOPIC-ADD ON: Squamous Epithelial / LPF: NONE SEEN

## 2015-11-29 LAB — COMPREHENSIVE METABOLIC PANEL
ALBUMIN: 3 g/dL — AB (ref 3.5–5.0)
ALT: 39 U/L (ref 17–63)
AST: 38 U/L (ref 15–41)
Alkaline Phosphatase: 283 U/L — ABNORMAL HIGH (ref 38–126)
Anion gap: 9 (ref 5–15)
BUN: 23 mg/dL — ABNORMAL HIGH (ref 6–20)
CHLORIDE: 109 mmol/L (ref 101–111)
CO2: 23 mmol/L (ref 22–32)
CREATININE: 1.71 mg/dL — AB (ref 0.61–1.24)
Calcium: 8.7 mg/dL — ABNORMAL LOW (ref 8.9–10.3)
GFR calc non Af Amer: 32 mL/min — ABNORMAL LOW (ref >60)
GFR, EST AFRICAN AMERICAN: 37 mL/min — AB (ref >60)
GLUCOSE: 92 mg/dL (ref 65–99)
Potassium: 4.1 mmol/L (ref 3.5–5.1)
SODIUM: 141 mmol/L (ref 135–145)
Total Bilirubin: 0.9 mg/dL (ref 0.3–1.2)
Total Protein: 6.2 g/dL — ABNORMAL LOW (ref 6.5–8.1)

## 2015-11-29 MED ORDER — SODIUM CHLORIDE 0.9 % IV BOLUS (SEPSIS)
1000.0000 mL | Freq: Once | INTRAVENOUS | Status: AC
  Administered 2015-11-29: 1000 mL via INTRAVENOUS

## 2015-11-29 NOTE — ED Notes (Signed)
See MD [place] note regarding suprapubic catheter.  6.5 mL removed and 59mL reinserted to the balloon.  Patient denied pain during procedure.  Applied a dressing around the catheter port.

## 2015-11-29 NOTE — Discharge Instructions (Signed)
As discussed, your evaluation today has been largely reassuring.  But, it is important that you monitor your condition carefully, and do not hesitate to return to the ED if you develop new, or concerning changes in your condition.  Please drink plenty of fluids, and monitor your urine output carefully.  Otherwise, please follow-up with your physicians for appropriate ongoing care.

## 2015-11-29 NOTE — ED Notes (Signed)
EMS - Patient coming from [facility] with c/o of fluids coming from the supra-pubic catheter.  The catheter was replaced on 11/09/15 here.  Last diagnosed with UTI.  Patients pants were soaked in urine on initial assessment of area in concern.  Bag is intact.

## 2015-11-29 NOTE — ED Provider Notes (Signed)
CSN: [place]:[id number]     Arrival date & time 11/29/15  1229 History   First MD Initiated Contact with Patient 11/29/15 1231     Chief Complaint  Patient presents with  . Urinary Tract Infection    HPI  Patient presents from his nursing facility with concern of weakness. Per report the patient has had weakness with upright positioning today. Staff also had concern about leakage from the patient's suprapubic catheter. Patient cannot specify why he has a suprapubic catheter other than prior surgical issue. No report of new fever, fall, syncope, vomiting, diarrhea.   Past Medical History  Diagnosis Date  . Hypertension   . Prostate enlargement   . Stroke [facility])     no residual weakness  . A-fib ([place])   . Hyperlipidemia   . Gout   . Insomnia    Past Surgical History  Procedure Laterality Date  . Suprapubic catheter insertion     Family History  Problem Relation Age of Onset  . Stroke Father    Social History  Substance Use Topics  . Smoking status: Never Smoker   . Smokeless tobacco: None  . Alcohol Use: No    Review of Systems  Constitutional:       Per HPI, otherwise negative  HENT:       Per HPI, otherwise negative  Respiratory:       Per HPI, otherwise negative  Cardiovascular:       Per HPI, otherwise negative  Gastrointestinal: Negative for vomiting.  Endocrine:       Negative aside from HPI  Genitourinary:       Foley changed 11/09/15  Musculoskeletal:       Per HPI, otherwise negative  Skin: Negative.   Neurological: Positive for weakness. Negative for syncope.      Allergies  Review of patient's allergies indicates no known allergies.  Home Medications   Prior to Admission medications   Medication Sig Start Date End Date Taking? Authorizing Provider  allopurinol (ZYLOPRIM) 100 MG tablet Take 100 mg by mouth at bedtime.    Yes Historical Provider, MD  aspirin EC 81 MG tablet Take 81 mg by mouth daily.   Yes Historical Provider, MD  atorvastatin  (LIPITOR) 40 MG tablet Take 1 tablet (40 mg total) by mouth daily. 07/31/15  Yes Tasrif Ahmed, MD  bisoprolol-hydrochlorothiazide (ZIAC) 10-6.25 MG tablet Take 1 tablet by mouth daily. 11/17/15  Yes Historical Provider, MD  diclofenac sodium (VOLTAREN) 1 % GEL APPLY 2GRAMS TO NECK & ACROSS SHOULDERS EVERY 6 HOURS WHILE AWAKE. 08/18/15  Yes Historical Provider, MD  ferrous sulfate 325 (65 FE) MG tablet Take 1 tablet (325 mg total) by mouth daily with breakfast. 11/10/15  Yes Maryellen Pile, MD  gabapentin (NEURONTIN) 100 MG capsule Take 200 mg by mouth at bedtime.   Yes Historical Provider, MD  guaiFENesin (MUCINEX) 600 MG 12 hr tablet Take 600 mg by mouth 2 (two) times daily.   Yes Historical Provider, MD  hydrochlorothiazide (MICROZIDE) 12.5 MG capsule Take 1 capsule (12.5 mg total) by mouth daily. 11/10/15  Yes Maryellen Pile, MD   BP 126/65 mmHg  Pulse 104  Temp(Src) 97.6 F (36.4 C) (Oral)  Resp 17  Ht 6\' 1"  (1.854 m)  Wt 156 lb (70.761 kg)  BMI 20.59 kg/m2  SpO2 96% Physical Exam  Constitutional: He is oriented to person, place, and time. He has a sickly appearance.  HENT:  Head: Normocephalic and atraumatic.  Eyes: Conjunctivae and EOM are normal.  Cardiovascular: Normal rate and regular rhythm.   Pulmonary/Chest: Effort normal. No stridor. No respiratory distress.  Abdominal: He exhibits no distension.    Genitourinary: Penis normal.  Musculoskeletal: He exhibits no edema.  Neurological: He is alert and oriented to person, place, and time.  Skin: Skin is warm and dry.  Psychiatric: He has a normal mood and affect.  Nursing note and vitals reviewed.   ED Course  SUPRAPUBIC TUBE PLACEMENT Date/Time: 11/29/2015 4:10 PM Performed by: Carmin Muskrat Authorized by: Carmin Muskrat Consent: The procedure was performed in an emergent situation. Verbal consent obtained. Risks and benefits: risks, benefits and alternatives were discussed Consent given by: patient Patient  understanding: patient states understanding of the procedure being performed Patient consent: the patient's understanding of the procedure matches consent given Procedure consent: procedure consent matches procedure scheduled Relevant documents: relevant documents present and verified Test results: test results available and properly labeled Site marked: the operative site was marked Imaging studies: imaging studies available Required items: required blood products, implants, devices, and special equipment available Patient identity confirmed: verbally with patient Time out: Immediately prior to procedure a "time out" was called to verify the correct patient, procedure, equipment, support staff and site/side marked as required. Indications: catheter change Local anesthesia used: no Patient sedated: no Preparation: Patient was prepped and draped in the usual sterile fashion. Comments: Patient had drainage from around the root of his suprapubic catheter. Aspiration of the balloon yielded 6 mL of fluid. 10 mL of fluid was injected into the balloon, with no pain. Patient continued to have appropriate urine and drainage following the procedure.   (including critical care time) Labs Review Labs Reviewed  COMPREHENSIVE METABOLIC PANEL - Abnormal; Notable for the following:    BUN 23 (*)    Creatinine, Ser 1.71 (*)    Calcium 8.7 (*)    Total Protein 6.2 (*)    Albumin 3.0 (*)    Alkaline Phosphatase 283 (*)    GFR calc non Af Amer 32 (*)    GFR calc Af Amer 37 (*)    All other components within normal limits  CBC WITH DIFFERENTIAL/PLATELET - Abnormal; Notable for the following:    Hemoglobin 10.9 (*)    HCT 36.0 (*)    MCH 23.7 (*)    RDW 22.7 (*)    All other components within normal limits  URINALYSIS, ROUTINE W REFLEX MICROSCOPIC (NOT AT [facility])    Imaging Review No results found. I have personally reviewed and evaluated these images and lab results as part of my medical  decision-making.   EKG Interpretation   Date/Time:  Monday November 29 2015 12:50:19 EST Ventricular Rate:  62 PR Interval:  188 QRS Duration: 101 QT Interval:  478 QTC Calculation: 485 R Axis:   64 Text Interpretation:  Sinus rhythm Atrial premature complexes LVH with  secondary repolarization abnormality Borderline prolonged QT interval  Sinus rhythm Premature atrial complexes Left ventricular hypertrophy  Artifact Abnormal ekg Confirmed by Carmin Muskrat  MD [phone]) on 11/29/2015  2:12:30 PM     Chart review notable for demonstration of multiple nondiagnostic urine culture tests within the past 4 months.   4:10 PM On repeat exam the patient appears better, has no complaints. I discussed all findings with patient and his son.  Specifically we discussed the patient's episodes of lightheadedness, dehydration. We also discussed pending urine culture study. With reassuring results beyond dehydration, and the patient's denial of an ongoing complaint, we discussed admission versus discharge with  close follow-up. Patient and son both voiced presents for close outpatient follow-up.   Patient tolerated adjustment, deflation, inflation of his suprapubic catheter well, with appropriate ongoing drainage of urine.   MDM  Patient presents from home after an episode of lightheadedness. Here the patient is awake, alert, hemodynamically stable. Patient does have evidence for dehydration, no overt urinary tract infection. Patient has suprapubic catheter, with clean external surface. No peritonitis, evidence for bacteremia or sepsis.  Urine culture sent, and after the patient improved with fluid resuscitation, adjustment of his suprapubic catheter, he was discharged in stable condition.   Carmin Muskrat, MD 11/29/15 [phone]

## 2015-12-01 ENCOUNTER — Encounter: Payer: Self-pay | Admitting: Neurology

## 2015-12-01 ENCOUNTER — Ambulatory Visit (INDEPENDENT_AMBULATORY_CARE_PROVIDER_SITE_OTHER): Payer: PPO | Admitting: Neurology

## 2015-12-01 VITALS — BP 117/56 | HR 53 | Ht 73.0 in | Wt 157.0 lb

## 2015-12-01 DIAGNOSIS — E860 Dehydration: Secondary | ICD-10-CM | POA: Diagnosis not present

## 2015-12-01 DIAGNOSIS — I6340 Cerebral infarction due to embolism of unspecified cerebral artery: Secondary | ICD-10-CM

## 2015-12-01 DIAGNOSIS — R55 Syncope and collapse: Secondary | ICD-10-CM

## 2015-12-01 DIAGNOSIS — D649 Anemia, unspecified: Secondary | ICD-10-CM

## 2015-12-01 DIAGNOSIS — Z9359 Other cystostomy status: Secondary | ICD-10-CM

## 2015-12-01 DIAGNOSIS — I480 Paroxysmal atrial fibrillation: Secondary | ICD-10-CM | POA: Diagnosis not present

## 2015-12-01 NOTE — Patient Instructions (Signed)
-   continue baby ASA and lipitor for stroke prevention - continue home health PT/OT - Follow up with your primary care physician for stroke risk factor modification. Recommend maintain blood pressure goal <130/80, diabetes with hemoglobin A1c goal below 6.5% and lipids with LDL cholesterol goal below 70 mg/dL.  - follow up with PCP for afib management - check BP at home, avoid low BP and dehydration. - follow up in 3 months.

## 2015-12-01 NOTE — Progress Notes (Signed)
STROKE NEUROLOGY FOLLOW UP NOTE  NAME: Charles [name] DOB: 12-Jun-1918  REASON FOR VISIT: stroke follow up HISTORY FROM: son and chart  Today we had the pleasure of seeing Charles [name] in follow-up at our Neurology Clinic. Pt was accompanied by son.   History Summary Charles [name] is a 82 y.o. male with history of HTN, stroke in 2010 with no residue, afib on amiodarone, and s/p suprapubic catheter with recent UTI admitted for AMS, dizziness, right facial droop, right side weakness, and nonverbal. Found to have AKI and dehydration. MRI also showed left paramedian, left thalamus and left occipitoparietal infarcts, consistent with embolic pattern, likely due to Afib not on AC. MRA showed diffuse intracranial athero. CUS and TTE unremarkable. EEG left temporal slowing but no seizure. LDL 92 and A1C 6.2. He was put on eliquis and lipitor on discharge back to ALF with [facility]  08/26/15 follow up - the patient has been doing well. Slowly recovery from stroke. However, he still takes baby ASA in addition to eliquis. He took ASA 81 before the stroke daily without bleeding issue, but since taking eliquis, he had nose bleeding every the other day with significant amount. He would like to stop the eliquis. He has not get any home health PT or OT yet. His son stated that pt intermittently has double vision, confusion, seems more in the condition of fatigue. BP 107/52 today.  Hospitalization 11/09/15 - He was admitted to [facility] on 11/09/15 due to syncope episode. Etiology was considered likely multifactorial, due to bradycardia, worsening anemia, and a possible urinary tract infection. His amiodarone and bisoprolol were discontinued given persistent bradycardia. He was also put on iron for anemia.   ED visit 11/29/15 - for generalized weakness and lightheadedness from NH at upright position. BP was low as per son. Improved after fluid resuscitation. Discharged back to NH.  Interval History During the interval time,  he has been doing stable. Son reported sleepiness during the day with multiple snaps, only gets up for meals. Also with confusion episodes. BP today 117/56.   REVIEW OF SYSTEMS: Full 14 system review of systems performed and notable only for those listed below and in HPI above, all others are negative:  Constitutional:  fatigue Cardiovascular:  Ear/Nose/Throat:  Hearing loss Skin:  Eyes:   Respiratory:   Gastroitestinal:   Genitourinary:  Hematology/Lymphatic:   Endocrine:  Musculoskeletal:   Allergy/Immunology:   Neurological:  Weakness, passing out Psychiatric: confusion Sleep: daytime sleepiness  The following represents the patient's updated allergies and side effects list: No Known Allergies  The neurologically relevant items on the patient's problem list were reviewed on today's visit.  Neurologic Examination  A problem focused neurological exam (12 or more points of the single system neurologic examination, vital signs counts as 1 point, cranial nerves count for 8 points) was performed.  Blood pressure 117/56, pulse 53, height 6\' 1"  (1.854 m), weight 157 lb (71.215 kg).  General - Well nourished, well developed, in no apparent distress.  Ophthalmologic - Fundi not visualized due to eye movement.  Cardiovascular - Regular rhythm, bradycardia with HR 53.  Mental Status -  Level of arousal and orientation to time, place, and person were intact. Language including expression, naming, repetition, comprehension was assessed and found intact. Slow in reaction to commands. Fund of Knowledge was assessed and was impaired.  Cranial Nerves II - XII - II - Visual field intact OU. III, IV, VI - Extraocular movements intact, no INO, no diplopia. V -  Facial sensation intact bilaterally. VII - Facial movement intact bilaterally. VIII - Hearing & vestibular intact bilaterally. X - Palate elevates symmetrically. XI - Chin turning & shoulder shrug intact bilaterally. XII - Tongue  protrusion intact.  Motor Strength - The patient's strength was normal in all extremities and pronator drift was absent.  Bulk was normal and fasciculations were absent.   Motor Tone - Muscle tone was assessed at the neck and appendages and was normal.  Reflexes - The patient's reflexes were 1+ in all extremities and he had no pathological reflexes.  Sensory - Light touch, temperature/pinprick were assessed and were normal.    Coordination - The patient had normal movements in the hands and feet with no ataxia or dysmetria.  Tremor was absent.  Gait and Station - walk with walker, stooped posture, very slow and shuffling gait.  Data reviewed: I personally reviewed the images and agree with the radiology interpretations.  Dg Chest 2 View  07/25/2015 IMPRESSION: 1. No acute thoracic findings. 2. Mild to moderate enlargement of the cardiopericardial silhouette, without edema. Atherosclerotic aortic arch. 3. Thoracic spondylosis.   Ct Head Wo Contrast  07/28/2015 IMPRESSION: Regional low attenuation within the left frontal parietal lobe may represent age-indeterminate, potentially subacute or infarct. Chronic left alignment lacunar infarct.   11/09/15 - No acute intracranial hemorrhage. Age-related atrophy and chronic microvascular ischemic disease.  Mri and Mra Head Wo Contrast  07/29/2015 IMPRESSION: 1. Small acute nonhemorrhagic infarcts in the left paramedian midbrain, left thalamus, and left occipital white matter. 2. As permitted by motion, no acute arterial finding. The bilateral posterior cerebral arteries are fetal type. 3. Diffuse intracranial atherosclerosis without treatable flow limiting stenosis.   Carotid Doppler Bilateral: 1-39% ICA stenosis. Vertebral artery flow is antegrade.  2D Echocardiogram - Left ventricle: The cavity size was normal. Wall thickness was increased in a pattern of moderate LVH. Systolic function was normal. The estimated ejection  fraction was in the range of 50% to 55%. Wall motion was normal; there were no regional wall motion abnormalities. Doppler parameters are consistent with abnormal left ventricular relaxation (grade 1 diastolic dysfunction). - Aortic valve: There was mild regurgitation. - Mitral valve: Calcified annulus. - Right ventricle: The cavity size was mildly dilated. - Right atrium: The atrium was mildly dilated. Impressions: - No cardiac source of emboli was indentified.  EKG sinus bradycardia. For complete results please see formal report.  EEG - Impression: This awake and asleep EEG is abnormal due to occasional focal slowing over the left temporal region. Clinical Correlation of the above findings indicates focal cerebral dysfunction over the left temporal region suggestive of underlying structural or physiologic abnormality. The absence of epileptiform discharges does not exclude a clinical diagnosis of epilepsy. Clinical correlation is advised  Component     Latest Ref Rng 07/29/2015  Cholesterol     0 - 200 mg/dL 159  Triglycerides     <150 mg/dL 130  HDL Cholesterol     >40 mg/dL 41  Total CHOL/HDL Ratio      3.9  VLDL     0 - 40 mg/dL 26  LDL (calc)     0 - 99 mg/dL 92  Hemoglobin A1C     4.8 - 5.6 % 6.2 (H)  Mean Plasma Glucose      131    Assessment: As you may recall, he is a 82 y.o. Caucasian male with PMH of HTN, stroke in 2010 with no residue, afib on amiodarone, and s/p suprapubic catheter  with recent UTI admitted for AMS, dizziness, right facial droop, right side weakness, and nonverbal. Found to have AKI and dehydration. MRI also showed left paramedian, left thalamus and left occipitoparietal infarcts, consistent with embolic pattern, likely due to Afib not on AC. MRA showed diffuse intracranial athero. CUS and TTE unremarkable. EEG left temporal slowing but no seizure. LDL 92 and A1C 6.2. He was put on eliquis and lipitor on discharge back to ALF with HH. However,  since taking eliquis, he had nose bleeding every the other day with significant amount. He would like to stop the eliquis. On exam, INO and the diplopia has resolved. During the interval time, he had admission to [facility] due to syncope and ED visit due to dehydration. No stroke like symptoms, but showed slow general decline with daytime sleepiness, confusion episodes.  Plan:  - continue baby ASA and lipitor for stroke prevention. - continue home health PT/OT - Follow up with your primary care physician for stroke risk factor modification. Recommend maintain blood pressure goal <130/80, diabetes with hemoglobin A1c goal below 6.5% and lipids with LDL cholesterol goal below 70 mg/dL.  - follow up with PCP for afib management - check BP at home, avoid low BP and dehydration. - continue supportive care - follow up in 3 months.  I spent more than 25 minutes of face to face time with the patient. Greater than 50% of time was spent in counseling and coordination of care.    No orders of the defined types were placed in this encounter.    Meds ordered this encounter  Medications  . BISOPROLOL FUMARATE PO    Sig: Take 5 mg by mouth. TAKE 1.5 DAILY    Patient Instructions  - continue baby ASA and lipitor for stroke prevention - continue home health PT/OT - Follow up with your primary care physician for stroke risk factor modification. Recommend maintain blood pressure goal <130/80, diabetes with hemoglobin A1c goal below 6.5% and lipids with LDL cholesterol goal below 70 mg/dL.  - follow up with PCP for afib management - check BP at home, avoid low BP and dehydration. - follow up in 3 months.    Rosalin Hawking, MD PhD [facility] [address] [place],  [address] [phone]

## 2015-12-08 ENCOUNTER — Other Ambulatory Visit: Payer: Self-pay | Admitting: Internal Medicine

## 2016-01-01 ENCOUNTER — Other Ambulatory Visit: Payer: Self-pay | Admitting: Internal Medicine

## 2016-02-10 ENCOUNTER — Emergency Department (HOSPITAL_COMMUNITY)
Admission: EM | Admit: 2016-02-10 | Discharge: 2016-02-11 | Disposition: A | Discharge status: Home / Self Care | Payer: PPO | Attending: Emergency Medicine | Admitting: Emergency Medicine

## 2016-02-10 ENCOUNTER — Encounter (HOSPITAL_COMMUNITY): Payer: Self-pay | Admitting: Emergency Medicine

## 2016-02-10 DIAGNOSIS — K625 Hemorrhage of anus and rectum: Secondary | ICD-10-CM | POA: Insufficient documentation

## 2016-02-10 DIAGNOSIS — Z8673 Personal history of transient ischemic attack (TIA), and cerebral infarction without residual deficits: Secondary | ICD-10-CM | POA: Diagnosis not present

## 2016-02-10 DIAGNOSIS — Z79899 Other long term (current) drug therapy: Secondary | ICD-10-CM | POA: Diagnosis not present

## 2016-02-10 DIAGNOSIS — I10 Essential (primary) hypertension: Secondary | ICD-10-CM | POA: Insufficient documentation

## 2016-02-10 DIAGNOSIS — N390 Urinary tract infection, site not specified: Secondary | ICD-10-CM | POA: Diagnosis not present

## 2016-02-10 DIAGNOSIS — R531 Weakness: Secondary | ICD-10-CM | POA: Diagnosis not present

## 2016-02-10 DIAGNOSIS — Z7982 Long term (current) use of aspirin: Secondary | ICD-10-CM | POA: Diagnosis not present

## 2016-02-10 DIAGNOSIS — F0390 Unspecified dementia without behavioral disturbance: Secondary | ICD-10-CM | POA: Insufficient documentation

## 2016-02-10 DIAGNOSIS — T83511A Infection and inflammatory reaction due to indwelling urethral catheter, initial encounter: Secondary | ICD-10-CM

## 2016-02-10 DIAGNOSIS — E785 Hyperlipidemia, unspecified: Secondary | ICD-10-CM | POA: Insufficient documentation

## 2016-02-10 LAB — URINALYSIS, ROUTINE W REFLEX MICROSCOPIC
Bilirubin Urine: NEGATIVE
GLUCOSE, UA: NEGATIVE mg/dL
Ketones, ur: NEGATIVE mg/dL
Nitrite: NEGATIVE
PH: 6 (ref 5.0–8.0)
Protein, ur: >300 mg/dL — AB
SPECIFIC GRAVITY, URINE: 1.025 (ref 1.005–1.030)

## 2016-02-10 LAB — URINE MICROSCOPIC-ADD ON

## 2016-02-10 NOTE — ED Notes (Signed)
Per EMS, patient fell out of bed at [facility]. Pt with no visible injuries at this time. Pt with altered mental status, unable to give chief complaint. Per the patient's son, patient with frequent UTIs that causes confusion. Pt has suprapubic catheter in at this time.

## 2016-02-10 NOTE — ED Provider Notes (Signed)
CSN: [id number]     Arrival date & time 02/10/16  2014 History   First MD Initiated Contact with Patient 02/10/16 2024     Chief Complaint  Patient presents with  . Fall     (Consider location/radiation/quality/duration/timing/severity/associated sxs/prior Treatment) Patient is a 82 y.o. male presenting with fall and diarrhea. The history is provided by the patient and a relative (the patients son and POA in person and the patients primary care doctor Reymundo Poll) over the phone).  Fall This is a recurrent problem. The current episode started today. Associated symptoms include fatigue and weakness. Pertinent negatives include no fever. Exacerbated by: frequent UTIs. Diarrhea for past two weeks.  Diarrhea Quality:  Watery Severity:  Severe Onset quality:  Gradual Duration:  2 weeks Timing:  Constant Progression:  Worsening Relieved by:  Nothing Associated symptoms: no fever     Past Medical History  Diagnosis Date  . Hypertension   . Prostate enlargement   . Stroke [facility])     no residual weakness  . A-fib ([place])   . Hyperlipidemia   . Gout   . Insomnia    Past Surgical History  Procedure Laterality Date  . Suprapubic catheter insertion     Family History  Problem Relation Age of Onset  . Stroke Father    Social History  Substance Use Topics  . Smoking status: Never Smoker   . Smokeless tobacco: None  . Alcohol Use: No    Review of Systems  Unable to perform ROS: Dementia  Constitutional: Positive for fatigue. Negative for fever.  Gastrointestinal: Positive for diarrhea.  Neurological: Positive for weakness.      Allergies  Bactrim  Home Medications   Prior to Admission medications   Medication Sig Start Date End Date Taking? Authorizing Provider  aspirin EC 81 MG tablet Take 81 mg by mouth daily.   Yes Historical Provider, MD  atorvastatin (LIPITOR) 40 MG tablet Take 1 tablet (40 mg total) by mouth daily. 07/31/15  Yes Tasrif Ahmed, MD  BISOPROLOL  FUMARATE PO Take 75 mg by mouth daily. TAKE 1.5 DAILY   Yes Historical Provider, MD  cephALEXin (KEFLEX) 250 MG capsule Take 250 mg by mouth 3 (three) times daily. Started a week ago from today (02-10-16)   Yes Historical Provider, MD  gabapentin (NEURONTIN) 100 MG capsule Take 200 mg by mouth at bedtime.   Yes Historical Provider, MD  Mesalamine 4 GM/60ML SF ENEM Place 1 enema rectally every evening.   Yes Historical Provider, MD  OVER THE COUNTER MEDICATION Medication: D-mannose with cranberry   Yes Historical Provider, MD  ferrous sulfate 325 (65 FE) MG tablet Take 1 tablet (325 mg total) by mouth daily with breakfast. Patient not taking: Reported on 02/10/2016 11/10/15   Maryellen Pile, MD   BP 160/72 mmHg  Pulse 60  Temp(Src) 98.2 F (36.8 C) (Oral)  Resp 17  SpO2 100% Physical Exam  Constitutional: He appears well-developed and well-nourished. No distress.  HENT:  Head: Normocephalic and atraumatic.  Right Ear: External ear normal.  Left Ear: External ear normal.  Nose: Nose normal.  Eyes: Conjunctivae and EOM are normal. Pupils are equal, round, and reactive to light.  Neck: Normal range of motion. Neck supple. No JVD present. No thyromegaly present.  Cardiovascular: Normal rate, regular rhythm and intact distal pulses.   Pulmonary/Chest: Effort normal and breath sounds normal. No respiratory distress.  Abdominal: Soft. Bowel sounds are normal. He exhibits no distension. There is tenderness (mild diffuse tenderness.).  Genitourinary:  Supra pubic catheter in place. No surrounding pus drainage or signs of acute site infection.  Musculoskeletal: Normal range of motion. He exhibits no edema or tenderness.  Neurological: He exhibits normal muscle tone.  Sleeping but easily arousable. Oriented to person but not to place or time (baselien per son)  Skin: Skin is warm and dry. He is not diaphoretic.    ED Course  Procedures (including critical care time) Labs Review Labs Reviewed   CBC WITH DIFFERENTIAL/PLATELET - Abnormal; Notable for the following:    Hemoglobin 12.3 (*)    MCH 25.6 (*)    RDW 18.8 (*)    All other components within normal limits  BASIC METABOLIC PANEL - Abnormal; Notable for the following:    BUN 36 (*)    Creatinine, Ser 1.82 (*)    Calcium 8.5 (*)    GFR calc non Af Amer 30 (*)    GFR calc Af Amer 34 (*)    All other components within normal limits  URINALYSIS, ROUTINE W REFLEX MICROSCOPIC (NOT AT [facility]) - Abnormal; Notable for the following:    Color, Urine RED (*)    APPearance TURBID (*)    Hgb urine dipstick LARGE (*)    Protein, ur >300 (*)    Leukocytes, UA LARGE (*)    All other components within normal limits  URINE MICROSCOPIC-ADD ON - Abnormal; Notable for the following:    Squamous Epithelial / LPF 0-5 (*)    Bacteria, UA FEW (*)    All other components within normal limits  URINE CULTURE    Imaging Review No results found. I have personally reviewed and evaluated these images and lab results as part of my medical decision-making.   EKG Interpretation None      MDM   Final diagnoses:  Rectal bleeding    The patient is a 82 year old male with a history of suprapubic catheter insertion for urinary retention as well as previous UTIs, currently on Keflex for UTI, who presents for fall. The patient rolled out of bed today falling onto a carpeted floor. No loss of consciousness. No neck tenderness and patient is ranging neck without any appaernt pain, do not suspect cervical spine fracture. I have called the patient's primary care doctor Dr. Fredderick Phenix and reviewed the patient's previous medical course with him. Dr. Fredderick Phenix is concerned about possible anemia or abdominal pelvis pathology as the patient has had profuse diarrhea and blood in his suprapubic catheter for 2 weeks. Laboratory evaluation obtained and shows patient is not anemic with hemoglobin above 12. Patient does have signs of persistent urinary tract infection  therefore we will upgrade his antibiotic to ciprofloxacin. Head CT ordered due to fall and abdomen pelvis CT ordered due to primary care doctor recommendations. I have transferred patient care to Dr. Billy Fischer for follow up of CTs.  Patient seen with attending, Dr. Ashok Cordia, who oversaw clinical decision making.     Margaretann Loveless, MD 02/11/16 0111  Lajean Saver, MD 02/12/16 [phone]

## 2016-02-11 ENCOUNTER — Emergency Department (HOSPITAL_COMMUNITY): Payer: PPO

## 2016-02-11 LAB — CBC WITH DIFFERENTIAL/PLATELET
BASOS ABS: 0 10*3/uL (ref 0.0–0.1)
BASOS PCT: 0 %
EOS ABS: 0.2 10*3/uL (ref 0.0–0.7)
EOS PCT: 3 %
HCT: 39.1 % (ref 39.0–52.0)
Hemoglobin: 12.3 g/dL — ABNORMAL LOW (ref 13.0–17.0)
Lymphocytes Relative: 16 %
Lymphs Abs: 1.2 10*3/uL (ref 0.7–4.0)
MCH: 25.6 pg — ABNORMAL LOW (ref 26.0–34.0)
MCHC: 31.5 g/dL (ref 30.0–36.0)
MCV: 81.5 fL (ref 78.0–100.0)
MONO ABS: 0.6 10*3/uL (ref 0.1–1.0)
Monocytes Relative: 8 %
Neutro Abs: 5.6 10*3/uL (ref 1.7–7.7)
Neutrophils Relative %: 73 %
PLATELETS: 177 10*3/uL (ref 150–400)
RBC: 4.8 MIL/uL (ref 4.22–5.81)
RDW: 18.8 % — AB (ref 11.5–15.5)
WBC: 7.6 10*3/uL (ref 4.0–10.5)

## 2016-02-11 LAB — BASIC METABOLIC PANEL
ANION GAP: 8 (ref 5–15)
BUN: 36 mg/dL — ABNORMAL HIGH (ref 6–20)
CALCIUM: 8.5 mg/dL — AB (ref 8.9–10.3)
CO2: 23 mmol/L (ref 22–32)
Chloride: 109 mmol/L (ref 101–111)
Creatinine, Ser: 1.82 mg/dL — ABNORMAL HIGH (ref 0.61–1.24)
GFR calc non Af Amer: 30 mL/min — ABNORMAL LOW (ref >60)
GFR, EST AFRICAN AMERICAN: 34 mL/min — AB (ref >60)
GLUCOSE: 94 mg/dL (ref 65–99)
POTASSIUM: 3.7 mmol/L (ref 3.5–5.1)
SODIUM: 140 mmol/L (ref 135–145)

## 2016-02-11 MED ORDER — IOHEXOL 300 MG/ML  SOLN
80.0000 mL | Freq: Once | INTRAMUSCULAR | Status: AC | PRN
  Administered 2016-02-11: 80 mL via INTRAVENOUS

## 2016-02-11 MED ORDER — CIPROFLOXACIN HCL 500 MG PO TABS: 500.0000 mg | ORAL_TABLET | Freq: Two times a day (BID) | ORAL | Status: DC

## 2016-02-11 MED ORDER — CIPROFLOXACIN HCL 500 MG PO TABS
500.0000 mg | ORAL_TABLET | Freq: Two times a day (BID) | ORAL | Status: DC
  Administered 2016-02-11: 500 mg via ORAL
  Filled 2016-02-11: qty 1

## 2016-02-12 LAB — URINE CULTURE: CULTURE: NO GROWTH

## 2016-02-13 NOTE — ED Provider Notes (Signed)
Received care of patient around midnight. Please see Dr. Rhina Brackett and Dr. Rockie Neighbours note for prior care, history and physical.  Briefly this is a 82yo male who presents with concern for rolling out of bed, as well as diarrhea, blood in suprapubic catheter. CT ordered and pending.  CT shows proctitis and findings of urinary tract infection. 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  Abx changed to cipro for UTI and recommend PCP/urology follow up for continued care. Patient hemodynamically stable, appropriate for outpatient follow up.  Patient discharged in stable condition with understanding of reasons to return.    CT ABD/PELV IMPRESSION: Trabeculated appearance of the bladder wall with diffuse thickening and inflammatory changes compatible with chronic bladder outlet obstruction with superimposed infection. Correlation with urinalysis recommended. Multiple bladder calculi noted.  Enlarged prostate gland.  Circumferential thickening of the rectum concerning for proctitis. Correlation with clinical exam recommended.  Extensive colonic diverticulosis. No evidence of bowel obstruction.  Cholelithiasis.  CT HEAD IMPRESSION: No acute intracranial hemorrhage.  Age-related atrophy and chronic microvascular ischemic changes. Small focal left frontal old infarct.  Charles Morgan, MD 02/13/16 2102

## 2016-03-06 ENCOUNTER — Ambulatory Visit (INDEPENDENT_AMBULATORY_CARE_PROVIDER_SITE_OTHER): Payer: PPO | Admitting: Neurology

## 2016-03-06 ENCOUNTER — Encounter: Payer: Self-pay | Admitting: Neurology

## 2016-03-06 VITALS — Ht 73.0 in | Wt 150.6 lb

## 2016-03-06 DIAGNOSIS — I480 Paroxysmal atrial fibrillation: Secondary | ICD-10-CM | POA: Diagnosis not present

## 2016-03-06 DIAGNOSIS — N390 Urinary tract infection, site not specified: Secondary | ICD-10-CM

## 2016-03-06 DIAGNOSIS — Z9359 Other cystostomy status: Secondary | ICD-10-CM

## 2016-03-06 DIAGNOSIS — I6340 Cerebral infarction due to embolism of unspecified cerebral artery: Secondary | ICD-10-CM

## 2016-03-06 NOTE — Progress Notes (Signed)
STROKE NEUROLOGY FOLLOW UP NOTE  NAME: Charles [name] DOB: 12-12-1917  REASON FOR VISIT: stroke follow up HISTORY FROM: son and chart  Today we had the pleasure of seeing Charles [name] in follow-up at our Neurology Clinic. Pt was accompanied by son.   History Summary Charles [name] is a 82 y.o. male with history of HTN, stroke in 2010 with no residue, afib on amiodarone, and s/p suprapubic catheter with recent UTI admitted for AMS, dizziness, right facial droop, right side weakness, and nonverbal. Found to have AKI and dehydration. MRI also showed left paramedian, left thalamus and left occipitoparietal infarcts, consistent with embolic pattern, likely due to Afib not on AC. MRA showed diffuse intracranial athero. CUS and TTE unremarkable. EEG left temporal slowing but no seizure. LDL 92 and A1C 6.2. He was put on eliquis and lipitor on discharge back to ALF with [facility].  08/26/15 follow up - the patient has been doing well. Slowly recovery from stroke. However, he still takes baby ASA in addition to eliquis. He took ASA 81 before the stroke daily without bleeding issue, but since taking eliquis, he had nose bleeding every the other day with significant amount. He would like to stop the eliquis. He has not get any home health PT or OT yet. His son stated that pt intermittently has double vision, confusion, seems more in the condition of fatigue. BP 107/52 today.  Hospitalization 11/09/15 - He was admitted to [facility] on 11/09/15 due to syncope episode. Etiology was considered likely multifactorial, due to bradycardia, worsening anemia, and a possible urinary tract infection. His amiodarone and bisoprolol were discontinued given persistent bradycardia. He was also put on iron for anemia.   ED visit 11/29/15 - for generalized weakness and lightheadedness from NH at upright position. BP was low as per son. Improved after fluid resuscitation. Discharged back to [facility].  12/01/15 follow up - he has been doing  stable. Son reported sleepiness during the day with multiple snaps, only gets up for meals. Also with confusion episodes. BP today 117/56.   Interval History During the interval time, pt has been doing well from stroke standpoint. He had ED visit last month of UTI, currently followed up with Urology and off Abx now. He does complain of testicular pain. BP 145/77 today in clinic.   REVIEW OF SYSTEMS: Full 14 system review of systems performed and notable only for those listed below and in HPI above, all others are negative:  Constitutional:  fatigue Cardiovascular:  Ear/Nose/Throat:  Hearing loss Skin:  Eyes:   Respiratory:   Gastroitestinal:  Diarrhea, incontinence of bowels Genitourinary: testicular pain, blood in urine Hematology/Lymphatic:   Endocrine:  Musculoskeletal:   Allergy/Immunology:   Neurological:  Weakness, passing out Psychiatric: confusion Sleep: daytime sleepiness  The following represents the patient's updated allergies and side effects list: Allergies  Allergen Reactions  . Bactrim [Sulfamethoxazole-Trimethoprim]     Bowel problems     The neurologically relevant items on the patient's problem list were reviewed on today's visit.  Neurologic Examination  A problem focused neurological exam (12 or more points of the single system neurologic examination, vital signs counts as 1 point, cranial nerves count for 8 points) was performed.  Height 6\' 1"  (1.854 m), weight 150 lb 9.6 oz (68.312 kg).  General - Well nourished, well developed, in no apparent distress.  Ophthalmologic - Fundi not visualized due to eye movement.  Cardiovascular - Regular rhythm, bradycardia with HR 55.  Mental Status -  Level  of arousal and orientation to time, place, and person were intact. Language including expression, naming, repetition, comprehension was assessed and found intact. Slow in reaction to commands. Fund of Knowledge was assessed and was impaired.  Cranial Nerves  II - XII - II - Visual field intact OU. III, IV, VI - Extraocular movements intact, no INO, no diplopia. V - Facial sensation intact bilaterally. VII - Facial movement intact bilaterally. VIII - Hearing & vestibular intact bilaterally. X - Palate elevates symmetrically. XI - Chin turning & shoulder shrug intact bilaterally. XII - Tongue protrusion intact.  Motor Strength - The patient's strength was normal in all extremities and pronator drift was absent.  Bulk was normal and fasciculations were absent.   Motor Tone - Muscle tone was assessed at the neck and appendages and was normal.  Reflexes - The patient's reflexes were 1+ in all extremities and he had no pathological reflexes.  Sensory - Light touch, temperature/pinprick were assessed and were normal.    Coordination - The patient had normal movements in the hands and feet with no ataxia or dysmetria.  Tremor was absent.  Gait and Station - walk with walker, stooped posture, very slow and shuffling gait, difficulty standing up from chair.  Data reviewed: I personally reviewed the images and agree with the radiology interpretations.  Dg Chest 2 View  07/25/2015 IMPRESSION: 1. No acute thoracic findings. 2. Mild to moderate enlargement of the cardiopericardial silhouette, without edema. Atherosclerotic aortic arch. 3. Thoracic spondylosis.   Ct Head Wo Contrast  07/28/2015 IMPRESSION: Regional low attenuation within the left frontal parietal lobe may represent age-indeterminate, potentially subacute or infarct. Chronic left alignment lacunar infarct.   11/09/15 - No acute intracranial hemorrhage. Age-related atrophy and chronic microvascular ischemic disease.  Mri and Mra Head Wo Contrast  07/29/2015 IMPRESSION: 1. Small acute nonhemorrhagic infarcts in the left paramedian midbrain, left thalamus, and left occipital white matter. 2. As permitted by motion, no acute arterial finding. The bilateral posterior cerebral  arteries are fetal type. 3. Diffuse intracranial atherosclerosis without treatable flow limiting stenosis.   Carotid Doppler Bilateral: 1-39% ICA stenosis. Vertebral artery flow is antegrade.  2D Echocardiogram - Left ventricle: The cavity size was normal. Wall thickness was increased in a pattern of moderate LVH. Systolic function was normal. The estimated ejection fraction was in the range of 50% to 55%. Wall motion was normal; there were no regional wall motion abnormalities. Doppler parameters are consistent with abnormal left ventricular relaxation (grade 1 diastolic dysfunction). - Aortic valve: There was mild regurgitation. - Mitral valve: Calcified annulus. - Right ventricle: The cavity size was mildly dilated. - Right atrium: The atrium was mildly dilated. Impressions: - No cardiac source of emboli was indentified.  EKG sinus bradycardia. For complete results please see formal report.  EEG - Impression: This awake and asleep EEG is abnormal due to occasional focal slowing over the left temporal region. Clinical Correlation of the above findings indicates focal cerebral dysfunction over the left temporal region suggestive of underlying structural or physiologic abnormality. The absence of epileptiform discharges does not exclude a clinical diagnosis of epilepsy. Clinical correlation is advised  Component     Latest Ref Rng 07/29/2015  Cholesterol     0 - 200 mg/dL 159  Triglycerides     <150 mg/dL 130  HDL Cholesterol     >40 mg/dL 41  Total CHOL/HDL Ratio      3.9  VLDL     0 - 40 mg/dL 26  LDL (calc)     0 - 99 mg/dL 92  Hemoglobin A1C     4.8 - 5.6 % 6.2 (H)  Mean Plasma Glucose      131    Assessment: As you may recall, he is a 82 y.o. Caucasian male with PMH of HTN, stroke in 2010 with no residue, afib on amiodarone, and s/p suprapubic catheter with recent UTI admitted for AMS, dizziness, right facial droop, right side weakness, and nonverbal.  Found to have AKI and dehydration. MRI also showed left paramedian, left thalamus and left occipitoparietal infarcts, consistent with embolic pattern, likely due to Afib not on AC. MRA showed diffuse intracranial athero. CUS and TTE unremarkable. EEG left temporal slowing but no seizure. LDL 92 and A1C 6.2. He was put on eliquis and lipitor on discharge back to ALF with [facility]. However, since taking eliquis, he had nose bleeding every the other day with significant amount. Eliquis stopped as per pt and family wishes. On exam, INO and the diplopia has resolved. During the interval time, he had admission to [facility] due to syncope and ED visit due to dehydration, and UTI. No stroke like symptoms.  Plan:  - continue baby ASA and lipitor for stroke prevention. - continue home health PT/OT - Follow up with your primary care physician for stroke risk factor modification. Recommend maintain blood pressure goal <130/80, diabetes with hemoglobin A1c goal below 6.5% and lipids with LDL cholesterol goal below 70 mg/dL.  - follow up with PCP for afib management - follow up with urologist.  - check BP at home, avoid low BP and dehydration. - follow up as needed.   No orders of the defined types were placed in this encounter.    Meds ordered this encounter  Medications  . mirabegron ER (MYRBETRIQ) 25 MG TB24 tablet    Sig: Take 25 mg by mouth daily.    There are no Patient Instructions on file for this visit.  Rosalin Hawking, MD PhD [facility] [address], [place],  [address] [phone]

## 2016-03-06 NOTE — Patient Instructions (Addendum)
-   continue baby ASA and lipitor for stroke prevention. - continue home health PT/OT - Follow up with your primary care physician for stroke risk factor modification. Recommend maintain blood pressure goal <130/80, diabetes with hemoglobin A1c goal below 6.5% and lipids with LDL cholesterol goal below 70 mg/dL.  - follow up with PCP for afib management - follow up with urologist.  - check BP at home, avoid low BP and dehydration. - follow up as needed.

## 2016-06-20 ENCOUNTER — Emergency Department (HOSPITAL_COMMUNITY): Payer: PPO

## 2016-06-20 ENCOUNTER — Inpatient Hospital Stay (HOSPITAL_COMMUNITY)
Admission: EM | Admit: 2016-06-20 | Discharge: 2016-06-22 | DRG: 689 | Disposition: A | Discharge status: Home Health | Payer: PPO | Attending: Internal Medicine | Admitting: Internal Medicine

## 2016-06-20 ENCOUNTER — Encounter (HOSPITAL_COMMUNITY): Payer: Self-pay | Admitting: Emergency Medicine

## 2016-06-20 DIAGNOSIS — I480 Paroxysmal atrial fibrillation: Secondary | ICD-10-CM | POA: Diagnosis present

## 2016-06-20 DIAGNOSIS — N390 Urinary tract infection, site not specified: Principal | ICD-10-CM | POA: Diagnosis present

## 2016-06-20 DIAGNOSIS — Z66 Do not resuscitate: Secondary | ICD-10-CM | POA: Diagnosis present

## 2016-06-20 DIAGNOSIS — Z882 Allergy status to sulfonamides status: Secondary | ICD-10-CM

## 2016-06-20 DIAGNOSIS — E785 Hyperlipidemia, unspecified: Secondary | ICD-10-CM | POA: Diagnosis present

## 2016-06-20 DIAGNOSIS — R410 Disorientation, unspecified: Secondary | ICD-10-CM

## 2016-06-20 DIAGNOSIS — Z823 Family history of stroke: Secondary | ICD-10-CM

## 2016-06-20 DIAGNOSIS — E860 Dehydration: Secondary | ICD-10-CM | POA: Diagnosis present

## 2016-06-20 DIAGNOSIS — I10 Essential (primary) hypertension: Secondary | ICD-10-CM | POA: Diagnosis present

## 2016-06-20 DIAGNOSIS — Z7982 Long term (current) use of aspirin: Secondary | ICD-10-CM

## 2016-06-20 DIAGNOSIS — N400 Enlarged prostate without lower urinary tract symptoms: Secondary | ICD-10-CM | POA: Diagnosis present

## 2016-06-20 DIAGNOSIS — R4182 Altered mental status, unspecified: Secondary | ICD-10-CM | POA: Diagnosis not present

## 2016-06-20 DIAGNOSIS — I69320 Aphasia following cerebral infarction: Secondary | ICD-10-CM

## 2016-06-20 DIAGNOSIS — G9340 Encephalopathy, unspecified: Secondary | ICD-10-CM | POA: Diagnosis present

## 2016-06-20 DIAGNOSIS — M109 Gout, unspecified: Secondary | ICD-10-CM | POA: Diagnosis present

## 2016-06-20 DIAGNOSIS — F05 Delirium due to known physiological condition: Secondary | ICD-10-CM | POA: Diagnosis not present

## 2016-06-20 DIAGNOSIS — F0390 Unspecified dementia without behavioral disturbance: Secondary | ICD-10-CM | POA: Diagnosis present

## 2016-06-20 DIAGNOSIS — N3001 Acute cystitis with hematuria: Secondary | ICD-10-CM

## 2016-06-20 DIAGNOSIS — E876 Hypokalemia: Secondary | ICD-10-CM | POA: Diagnosis present

## 2016-06-20 DIAGNOSIS — Z79899 Other long term (current) drug therapy: Secondary | ICD-10-CM

## 2016-06-20 LAB — COMPREHENSIVE METABOLIC PANEL
ALBUMIN: 3.1 g/dL — AB (ref 3.5–5.0)
ALK PHOS: 155 U/L — AB (ref 38–126)
ALT: 68 U/L — AB (ref 17–63)
ANION GAP: 7 (ref 5–15)
AST: 52 U/L — ABNORMAL HIGH (ref 15–41)
BUN: 37 mg/dL — ABNORMAL HIGH (ref 6–20)
CHLORIDE: 110 mmol/L (ref 101–111)
CO2: 25 mmol/L (ref 22–32)
CREATININE: 1.1 mg/dL (ref 0.61–1.24)
Calcium: 8.9 mg/dL (ref 8.9–10.3)
GFR calc non Af Amer: 55 mL/min — ABNORMAL LOW (ref >60)
GLUCOSE: 112 mg/dL — AB (ref 65–99)
Potassium: 3.5 mmol/L (ref 3.5–5.1)
SODIUM: 142 mmol/L (ref 135–145)
Total Bilirubin: 1.8 mg/dL — ABNORMAL HIGH (ref 0.3–1.2)
Total Protein: 6.5 g/dL (ref 6.5–8.1)

## 2016-06-20 LAB — CBC WITH DIFFERENTIAL/PLATELET
BASOS PCT: 0 %
Basophils Absolute: 0 10*3/uL (ref 0.0–0.1)
EOS ABS: 0.3 10*3/uL (ref 0.0–0.7)
EOS PCT: 5 %
HCT: 39.5 % (ref 39.0–52.0)
HEMOGLOBIN: 12.8 g/dL — AB (ref 13.0–17.0)
LYMPHS ABS: 0.4 10*3/uL — AB (ref 0.7–4.0)
Lymphocytes Relative: 7 %
MCH: 28 pg (ref 26.0–34.0)
MCHC: 32.4 g/dL (ref 30.0–36.0)
MCV: 86.4 fL (ref 78.0–100.0)
Monocytes Absolute: 0.4 10*3/uL (ref 0.1–1.0)
Monocytes Relative: 7 %
NEUTROS PCT: 81 %
Neutro Abs: 4.7 10*3/uL (ref 1.7–7.7)
PLATELETS: 158 10*3/uL (ref 150–400)
RBC: 4.57 MIL/uL (ref 4.22–5.81)
RDW: 14.8 % (ref 11.5–15.5)
WBC: 5.8 10*3/uL (ref 4.0–10.5)

## 2016-06-20 LAB — URINE MICROSCOPIC-ADD ON

## 2016-06-20 LAB — URINALYSIS, ROUTINE W REFLEX MICROSCOPIC
Bilirubin Urine: NEGATIVE
GLUCOSE, UA: NEGATIVE mg/dL
Ketones, ur: NEGATIVE mg/dL
NITRITE: NEGATIVE
PH: 8.5 — AB (ref 5.0–8.0)
Protein, ur: >300 mg/dL — AB
SPECIFIC GRAVITY, URINE: 1.01 (ref 1.005–1.030)

## 2016-06-20 LAB — I-STAT CG4 LACTIC ACID, ED
LACTIC ACID, VENOUS: 1.78 mmol/L (ref 0.5–1.9)
LACTIC ACID, VENOUS: 1.35 mmol/L (ref 0.5–1.9)

## 2016-06-20 LAB — MRSA PCR SCREENING: MRSA by PCR: POSITIVE — AB

## 2016-06-20 LAB — MAGNESIUM: Magnesium: 2 mg/dL (ref 1.7–2.4)

## 2016-06-20 MED ORDER — ACETAMINOPHEN 325 MG PO TABS: 650.0000 mg | ORAL_TABLET | Freq: Four times a day (QID) | ORAL | Status: DC | PRN

## 2016-06-20 MED ORDER — DEXTROSE 5 % IV SOLN
1.0000 g | INTRAVENOUS | Status: DC
  Administered 2016-06-21: 1 g via INTRAVENOUS
  Filled 2016-06-20: qty 10

## 2016-06-20 MED ORDER — ONDANSETRON HCL 4 MG PO TABS: 4.0000 mg | ORAL_TABLET | Freq: Four times a day (QID) | ORAL | Status: DC | PRN

## 2016-06-20 MED ORDER — CEFTRIAXONE SODIUM 1 G IJ SOLR
1.0000 g | Freq: Once | INTRAMUSCULAR | Status: AC
  Administered 2016-06-20: 1 g via INTRAVENOUS
  Filled 2016-06-20: qty 10

## 2016-06-20 MED ORDER — ONDANSETRON HCL 4 MG/2ML IJ SOLN: 4.0000 mg | Freq: Four times a day (QID) | INTRAMUSCULAR | Status: DC | PRN

## 2016-06-20 MED ORDER — ENOXAPARIN SODIUM 40 MG/0.4ML ~~LOC~~ SOLN
40.0000 mg | SUBCUTANEOUS | Status: DC
  Administered 2016-06-20 – 2016-06-21 (×2): 40 mg via SUBCUTANEOUS
  Filled 2016-06-20 (×2): qty 0.4

## 2016-06-20 MED ORDER — GABAPENTIN 100 MG PO CAPS
100.0000 mg | ORAL_CAPSULE | Freq: Every day | ORAL | Status: DC
Start: 2016-06-20 — End: 2016-06-22
  Administered 2016-06-20 – 2016-06-21 (×2): 100 mg via ORAL
  Filled 2016-06-20 (×2): qty 1

## 2016-06-20 MED ORDER — SODIUM CHLORIDE 0.9 % IV BOLUS (SEPSIS)
1000.0000 mL | Freq: Once | INTRAVENOUS | Status: AC
  Administered 2016-06-20: 1000 mL via INTRAVENOUS

## 2016-06-20 MED ORDER — ACETAMINOPHEN 650 MG RE SUPP: 650.0000 mg | Freq: Four times a day (QID) | RECTAL | Status: DC | PRN

## 2016-06-20 MED ORDER — ASPIRIN EC 81 MG PO TBEC
81.0000 mg | DELAYED_RELEASE_TABLET | Freq: Every day | ORAL | Status: DC
  Administered 2016-06-20 – 2016-06-22 (×3): 81 mg via ORAL
  Filled 2016-06-20 (×3): qty 1

## 2016-06-20 MED ORDER — ATORVASTATIN CALCIUM 40 MG PO TABS
40.0000 mg | ORAL_TABLET | Freq: Every day | ORAL | Status: DC
  Administered 2016-06-20 – 2016-06-22 (×3): 40 mg via ORAL
  Filled 2016-06-20 (×3): qty 1

## 2016-06-20 MED ORDER — MIRABEGRON ER 50 MG PO TB24
50.0000 mg | ORAL_TABLET | Freq: Every day | ORAL | Status: DC
  Administered 2016-06-20 – 2016-06-22 (×3): 50 mg via ORAL
  Filled 2016-06-20 (×3): qty 1

## 2016-06-20 MED ORDER — BISOPROLOL FUMARATE 5 MG PO TABS
7.5000 mg | ORAL_TABLET | Freq: Every day | ORAL | Status: DC
  Administered 2016-06-20 – 2016-06-22 (×3): 7.5 mg via ORAL
  Filled 2016-06-20: qty 1.5
  Filled 2016-06-20 (×2): qty 2

## 2016-06-20 NOTE — H&P (Signed)
History and Physical    Charles [name] [id number] DOB: Dec 02, 1917 DOA: 06/20/2016    PCP: Reymundo Poll, MD  Patient coming from: [facility] living with caregiver  Chief Complaint: confusion, weakness  HPI: Charles [name] is a 82 y.o. male with medical history significant of a-fib, HTN, CVA, prostate enlargement, suprapubic catheter who presents for a few days of confusion. History is provided by son who states that his father is usually independent but is confused now. He has not had a UTI in about 3 months. He also had some pain in his right elbow recently but no reason for this pain has been found.   ED Course: in the ER found to have pus in suprapubic cath. Cath changed by ER doctor. UA grossly positive for infection. CT head negative.  Review of Systems:  All other systems reviewed and apart from HPI, are negative.  Past Medical History:  Diagnosis Date  . A-fib ([place])   . Gout   . Hyperlipidemia   . Hypertension   . Insomnia   . Prostate enlargement   . Stroke [facility])    no residual weakness    Past Surgical History:  Procedure Laterality Date  . SUPRAPUBIC CATHETER INSERTION      Social History:   reports that he has never smoked. He has never used smokeless tobacco. He reports that he does not drink alcohol or use drugs.  Allergies  Allergen Reactions  . Bactrim [Sulfamethoxazole-Trimethoprim] Other (See Comments)    Bowel problems     Family History  Problem Relation Age of Onset  . Stroke Father      Prior to Admission medications   Medication Sig Start Date End Date Taking? Authorizing Provider  aspirin EC 81 MG tablet Take 81 mg by mouth daily.   Yes Historical Provider, MD  atorvastatin (LIPITOR) 40 MG tablet Take 1 tablet (40 mg total) by mouth daily. Patient taking differently: Take 40 mg by mouth every evening.  07/31/15  Yes Tasrif Ahmed, MD  bisoprolol (ZEBETA) 5 MG tablet Take 7.5 mg by mouth daily.   Yes Historical Provider, MD  gabapentin  (NEURONTIN) 100 MG capsule Take 100 mg by mouth at bedtime.    Yes Historical Provider, MD  mirabegron ER (MYRBETRIQ) 25 MG TB24 tablet Take 50 mg by mouth daily.    Yes Historical Provider, MD  OVER THE COUNTER MEDICATION Take 1 tablet by mouth daily. Medication: D-mannose with cranberry    Yes Historical Provider, MD  phenazopyridine (PYRIDIUM) 100 MG tablet Take 100 mg by mouth 3 (three) times daily with meals.   Yes Historical Provider, MD    Physical Exam: Vitals:   06/20/16 1441 06/20/16 1500 06/20/16 1530 06/20/16 1538  BP: 168/86 165/75 (!) 122/48   Pulse: 72 (!) 49  (!) 55  Resp: 22 21 20 22   Temp:      TempSrc:      SpO2: 98% 98%  95%  Weight:      Height:          Constitutional: NAD, calm, comfortable Eyes: PERTLA, lids and conjunctivae normal ENMT: Mucous membranes are moist. Posterior pharynx clear of any exudate or lesions. Normal dentition.  Neck: normal, supple, no masses, no thyromegaly Respiratory: clear to auscultation bilaterally, no wheezing, no crackles. Normal respiratory effort. No accessory muscle use.  Cardiovascular: S1 & S2 heard, regular rate and rhythm, no murmurs / rubs / gallops. No extremity edema. 2+ pedal pulses. No carotid bruits.  Abdomen: No distension, no tenderness,  no masses palpated. No hepatosplenomegaly. Bowel sounds normal. Suprapubic cath in place Musculoskeletal: no clubbing / cyanosis. No joint deformity upper and lower extremities.   Skin: no rashes, lesions, ulcers. No induration Neurologic: CN 2-12 grossly intact. Strength intact in extermities.  Psychiatric: alert but confused. Not agitated.     Labs on Admission: I have personally reviewed following labs and imaging studies  CBC:  Recent Labs Lab 06/20/16 1324  WBC 5.8  NEUTROABS 4.7  HGB 12.8*  HCT 39.5  MCV 86.4  PLT 0000000   Basic Metabolic Panel:  Recent Labs Lab 06/20/16 1324  NA 142  K 3.5  CL 110  CO2 25  GLUCOSE 112*  BUN 37*  CREATININE 1.10    CALCIUM 8.9  MG 2.0   GFR: Estimated Creatinine Clearance: 37.8 mL/min (by C-G formula based on SCr of 1.1 mg/dL). Liver Function Tests:  Recent Labs Lab 06/20/16 1324  AST 52*  ALT 68*  ALKPHOS 155*  BILITOT 1.8*  PROT 6.5  ALBUMIN 3.1*   No results for input(s): LIPASE, AMYLASE in the last 168 hours. No results for input(s): AMMONIA in the last 168 hours. Coagulation Profile: No results for input(s): INR, PROTIME in the last 168 hours. Cardiac Enzymes: No results for input(s): CKTOTAL, CKMB, CKMBINDEX, TROPONINI in the last 168 hours. BNP (last 3 results) No results for input(s): PROBNP in the last 8760 hours. HbA1C: No results for input(s): HGBA1C in the last 72 hours. CBG: No results for input(s): GLUCAP in the last 168 hours. Lipid Profile: No results for input(s): CHOL, HDL, LDLCALC, TRIG, CHOLHDL, LDLDIRECT in the last 72 hours. Thyroid Function Tests: No results for input(s): TSH, T4TOTAL, FREET4, T3FREE, THYROIDAB in the last 72 hours. Anemia Panel: No results for input(s): VITAMINB12, FOLATE, FERRITIN, TIBC, IRON, RETICCTPCT in the last 72 hours. Urine analysis:    Component Value Date/Time   COLORURINE BROWN (A) 06/20/2016 1302   APPEARANCEUR TURBID (A) 06/20/2016 1302   LABSPEC 1.010 06/20/2016 1302   PHURINE 8.5 (H) 06/20/2016 1302   GLUCOSEU NEGATIVE 06/20/2016 1302   HGBUR MODERATE (A) 06/20/2016 1302   BILIRUBINUR NEGATIVE 06/20/2016 1302   KETONESUR NEGATIVE 06/20/2016 1302   PROTEINUR >300 (A) 06/20/2016 1302   UROBILINOGEN 0.2 07/29/2015 0452   NITRITE NEGATIVE 06/20/2016 1302   LEUKOCYTESUR MODERATE (A) 06/20/2016 1302   Sepsis Labs: @LABRCNTIP (procalcitonin:4,lacticidven:4) )No results found for this or any previous visit (from the past 240 hour(s)).   Radiological Exams on Admission: Dg Chest 2 View  Result Date: 06/20/2016 CLINICAL DATA:  Altered mental status EXAM: CHEST  2 VIEW COMPARISON:  11/09/2015 FINDINGS: COPD with  hyperinflation of lungs. There is apical scarring bilaterally. Pleural thickening posteriorly on the right is unchanged. Negative for pneumonia. Negative for heart failure. Heart size within normal limits. IMPRESSION: COPD with scarring.  No change from the prior study. Electronically Signed   By: Franchot Gallo M.D.   On: 06/20/2016 14:05   Dg Elbow Complete Right  Result Date: 06/20/2016 CLINICAL DATA:  Altered mental status. Right elbow pain for 3 days. No known injury. EXAM: RIGHT ELBOW - COMPLETE 3+ VIEW COMPARISON:  None. FINDINGS: No fracture or dislocation is identified. A small elbow joint effusion is questioned. No destructive lesion or osseous erosion. Soft tissue thickening posterior to the olecranon. IV cannula in place in the forearm. IMPRESSION: Possible small elbow joint effusion. No acute osseous abnormality identified. Electronically Signed   By: Logan Bores M.D.   On: 06/20/2016 14:03   Ct  Head Wo Contrast  Result Date: 06/20/2016 CLINICAL DATA:  Dementia patient with decreased level of consciousness for 3 days. EXAM: CT HEAD WITHOUT CONTRAST TECHNIQUE: Contiguous axial images were obtained from the base of the skull through the vertex without intravenous contrast. COMPARISON:  None. Cuts pleasant FINDINGS: No evidence for acute infarction, hemorrhage, mass lesion, or extra-axial fluid. Global atrophy. Hydrocephalus ex vacuo. Chronic lacunar infarcts affect the BILATERAL basal ganglia, and LEFT thalamus. Remote cortical infarct LEFT frontal region. Vascular calcification. Prominent Pacchionian granulations with resultant calvarial remodeling. No skull fracture. No sinus or mastoid disease. IMPRESSION: Chronic changes as described. No acute intracranial findings. Electronically Signed   By: Staci Righter M.D.   On: 06/20/2016 13:54    EKG: Independently reviewed. A-fib with LVH  Assessment/Plan Principal Problem:   UTI (lower urinary tract infection) - with suprapubic cath -  rocephin- f/u cultures  Active Problems: Delirium due to another medical condition - due to above- needs sitter    Paroxysmal atrial fibrillation  - cont Zebeta and aspirin    Dehydration - slow IVF  LVH on EKG - gr 1 diastolic dysfunction on echo  DVT prophylaxis: Lovenox Code Status: DNR  Family Communication: son, POA  Disposition Plan: return to Independent living in 1-2 days  Consults called: none Admission status: observation    [place]ine MD [facility] Pager: [email or web address] Password [email or web address] 7PM-7AM, please contact night-coverage   06/20/2016, 5:54 PM

## 2016-06-20 NOTE — ED Notes (Signed)
Leg bag changed, stat locked placed.

## 2016-06-20 NOTE — ED Triage Notes (Signed)
Per EMS, patient comes from [facility].  Staff says that patient has had a decreased LOC  X 3 days.  Patient does have a history of dementia and recurrent UTIs.  Staff also said that hematuria has been noted in catheter yesterday and this morning.  EMS did not see any hematuria in catheter once patient transported.  Foul smelling urine.  EKG done by EMS.  Shows Afib.  Patient is not on any blood thinners but does take aspirin. BP 162/102, 84 HR, CBG 131, RR 16, No SOB. 20 G placed in left AC.  Son is POA. Son on way to hospital.

## 2016-06-20 NOTE — ED Notes (Signed)
Patient transported to CT 

## 2016-06-20 NOTE — ED Notes (Signed)
Second set of cultures drawn

## 2016-06-20 NOTE — ED Provider Notes (Signed)
[facility] DEPT Provider Note   CSN: [id number] Arrival date & time: 06/20/16  1147  First Provider Contact:  None       History   Chief Complaint Chief Complaint  Patient presents with  . Altered Mental Status  . Hematuria    HPI Charles [name] is a 82 y.o. male.  HPI Hx of stroke, residual aphasia Since Sunday has had generalized weakness, needed assistance getting up (normally able to do this independently-routinely has assistance with shower but normally able to get up, use walker) Needed assistance even going from sitting.  Complained of right arm pain last Thurs/Fri, and noticed over the weekend had difficulty using the right arm.  No falls. Appears generally weak and disoriented which is new.   Blood in the catheter bag this AM, clots present, which was new yesterday and this AM.    Decreased appetite for 2 days, not eating or drinking much.   Past Medical History:  Diagnosis Date  . A-fib ([place])   . Gout   . Hyperlipidemia   . Hypertension   . Insomnia   . Prostate enlargement   . Stroke [facility])    no residual weakness    Patient Active Problem List   Diagnosis Date Noted  . Delirium due to another medical condition 06/20/2016  . Cerebrovascular accident (CVA) due to embolism of cerebral artery ([place]) 12/01/2015  . Dehydration 12/01/2015  . Absolute anemia   . UTI (lower urinary tract infection)   . Anemia, unspecified   . Syncope and collapse   . Urinary tract infection, site not specified   . Syncope 11/09/2015  . Faintness   . Suprapubic catheter ([place]) 08/27/2015  . History of stroke 08/27/2015  . Stroke ([place])   . Paroxysmal atrial fibrillation ([facility])   . HLD (hyperlipidemia)   . CVA (cerebral infarction) 07/28/2015     Past Surgical History:  Procedure Laterality Date  . SUPRAPUBIC CATHETER INSERTION         Home Medications    Prior to Admission medications   Medication Sig Start Date End Date Taking? Authorizing Provider  aspirin EC  81 MG tablet Take 81 mg by mouth daily.   Yes Historical Provider, MD  atorvastatin (LIPITOR) 40 MG tablet Take 1 tablet (40 mg total) by mouth daily. Patient taking differently: Take 40 mg by mouth every evening.  07/31/15  Yes Tasrif Ahmed, MD  bisoprolol (ZEBETA) 5 MG tablet Take 7.5 mg by mouth daily.   Yes Historical Provider, MD  gabapentin (NEURONTIN) 100 MG capsule Take 100 mg by mouth at bedtime.    Yes Historical Provider, MD  mirabegron ER (MYRBETRIQ) 25 MG TB24 tablet Take 50 mg by mouth daily.    Yes Historical Provider, MD  OVER THE COUNTER MEDICATION Take 1 tablet by mouth daily. Medication: D-mannose with cranberry    Yes Historical Provider, MD  phenazopyridine (PYRIDIUM) 100 MG tablet Take 100 mg by mouth 3 (three) times daily with meals.   Yes Historical Provider, MD    Family History Family History  Problem Relation Age of Onset  . Stroke Father     Social History Social History  Substance Use Topics  . Smoking status: Never Smoker  . Smokeless tobacco: Never Used  . Alcohol use No     Allergies   Bactrim [sulfamethoxazole-trimethoprim]   Review of Systems Review of Systems  Unable to perform ROS: Dementia     Physical Exam Updated Vital Signs BP (!) 122/48   Pulse Marland Kitchen)  55   Temp 99.6 F (37.6 C) (Rectal)   Resp 22   Ht 6\' 1"  (1.854 m)   Wt 150 lb (68 kg)   SpO2 95%   BMI 19.79 kg/m   Physical Exam  Constitutional: He appears well-developed and well-nourished. No distress.  HENT:  Head: Normocephalic and atraumatic.  Eyes: Conjunctivae and EOM are normal.  Neck: Normal range of motion.  Cardiovascular: Normal rate, normal heart sounds and intact distal pulses.  An irregularly irregular rhythm present. Exam reveals no gallop and no friction rub.   No murmur heard. Pulmonary/Chest: Effort normal and breath sounds normal. No respiratory distress. He has no wheezes. He has no rales.  Abdominal: Soft. He exhibits no distension. There is tenderness  (mild suprapubic). There is no guarding.  Musculoskeletal: He exhibits no edema.       Right shoulder: He exhibits normal range of motion, no tenderness, no bony tenderness and normal strength.       Right elbow: He exhibits decreased range of motion (mild) and swelling. He exhibits no deformity and no laceration. Tenderness found.  Neurological: He is alert. He has normal strength. No cranial nerve deficit or sensory deficit. Coordination normal. Abnormal gait: deferred. GCS eye subscore is 4. GCS verbal subscore is 5. GCS motor subscore is 6.  Oriented to self, location, states it's 1981 Strength normal with exception of right elbow flexeion extension which he reports is limited by pain  Skin: Skin is warm and dry. Capillary refill takes less than 2 seconds. He is not diaphoretic.  Nursing note and vitals reviewed.       ED Treatments / Results  Labs (all labs ordered are listed, but only abnormal results are displayed) Labs Reviewed  CBC WITH DIFFERENTIAL/PLATELET - Abnormal; Notable for the following:       Result Value   Hemoglobin 12.8 (*)    Lymphs Abs 0.4 (*)    All other components within normal limits  COMPREHENSIVE METABOLIC PANEL - Abnormal; Notable for the following:    Glucose, Bld 112 (*)    BUN 37 (*)    Albumin 3.1 (*)    AST 52 (*)    ALT 68 (*)    Alkaline Phosphatase 155 (*)    Total Bilirubin 1.8 (*)    GFR calc non Af Amer 55 (*)    All other components within normal limits  URINALYSIS, ROUTINE W REFLEX MICROSCOPIC (NOT AT [facility]) - Abnormal; Notable for the following:    Color, Urine BROWN (*)    APPearance TURBID (*)    pH 8.5 (*)    Hgb urine dipstick MODERATE (*)    Protein, ur >300 (*)    Leukocytes, UA MODERATE (*)    All other components within normal limits  URINE MICROSCOPIC-ADD ON - Abnormal; Notable for the following:    Squamous Epithelial / LPF 0-5 (*)    Bacteria, UA MANY (*)    All other components within normal limits  URINE CULTURE    CULTURE, BLOOD (ROUTINE X 2)  CULTURE, BLOOD (ROUTINE X 2)  MAGNESIUM  BASIC METABOLIC PANEL  CBC  I-STAT CG4 LACTIC ACID, ED  I-STAT CG4 LACTIC ACID, ED    EKG  EKG Interpretation None       Radiology Dg Chest 2 View  Result Date: 06/20/2016 CLINICAL DATA:  Altered mental status EXAM: CHEST  2 VIEW COMPARISON:  11/09/2015 FINDINGS: COPD with hyperinflation of lungs. There is apical scarring bilaterally. Pleural thickening posteriorly on the  right is unchanged. Negative for pneumonia. Negative for heart failure. Heart size within normal limits. IMPRESSION: COPD with scarring.  No change from the prior study. Electronically Signed   By: Franchot Gallo M.D.   On: 06/20/2016 14:05   Dg Elbow Complete Right  Result Date: 06/20/2016 CLINICAL DATA:  Altered mental status. Right elbow pain for 3 days. No known injury. EXAM: RIGHT ELBOW - COMPLETE 3+ VIEW COMPARISON:  None. FINDINGS: No fracture or dislocation is identified. A small elbow joint effusion is questioned. No destructive lesion or osseous erosion. Soft tissue thickening posterior to the olecranon. IV cannula in place in the forearm. IMPRESSION: Possible small elbow joint effusion. No acute osseous abnormality identified. Electronically Signed   By: Logan Bores M.D.   On: 06/20/2016 14:03   Ct Head Wo Contrast  Result Date: 06/20/2016 CLINICAL DATA:  Dementia patient with decreased level of consciousness for 3 days. EXAM: CT HEAD WITHOUT CONTRAST TECHNIQUE: Contiguous axial images were obtained from the base of the skull through the vertex without intravenous contrast. COMPARISON:  None. Cuts pleasant FINDINGS: No evidence for acute infarction, hemorrhage, mass lesion, or extra-axial fluid. Global atrophy. Hydrocephalus ex vacuo. Chronic lacunar infarcts affect the BILATERAL basal ganglia, and LEFT thalamus. Remote cortical infarct LEFT frontal region. Vascular calcification. Prominent Pacchionian granulations with resultant calvarial  remodeling. No skull fracture. No sinus or mastoid disease. IMPRESSION: Chronic changes as described. No acute intracranial findings. Electronically Signed   By: Staci Righter M.D.   On: 06/20/2016 13:54    Procedures SUPRAPUBIC TUBE PLACEMENT Date/Time: 06/20/2016 6:05 PM Performed by: Gareth Morgan Authorized by: Gareth Morgan   Consent:    Consent obtained:  Verbal   Consent given by:  Healthcare agent Procedure details:    Complexity:  Simple   Catheter type:  Foley   Catheter size:  18 Fr   Ultrasound guidance: no     Number of attempts:  1   Urine characteristics:  Yellow, foul-smelling and cloudy Post-procedure details:    Patient tolerance of procedure:  Tolerated well, no immediate complications Comments:     Performed given concern for infection, 3wk since catheter placed, concern for infection source. Done under sterile conditions.   (including critical care time)  Medications Ordered in ED Medications  aspirin EC tablet 81 mg (not administered)  atorvastatin (LIPITOR) tablet 40 mg (not administered)  bisoprolol (ZEBETA) tablet 7.5 mg (not administered)  gabapentin (NEURONTIN) capsule 100 mg (not administered)  mirabegron ER (MYRBETRIQ) tablet 50 mg (not administered)  enoxaparin (LOVENOX) injection 40 mg (not administered)  acetaminophen (TYLENOL) tablet 650 mg (not administered)    Or  acetaminophen (TYLENOL) suppository 650 mg (not administered)  ondansetron (ZOFRAN) tablet 4 mg (not administered)    Or  ondansetron (ZOFRAN) injection 4 mg (not administered)  cefTRIAXone (ROCEPHIN) 1 g in dextrose 5 % 50 mL IVPB (not administered)  sodium chloride 0.9 % bolus 1,000 mL (0 mLs Intravenous Stopped 06/20/16 1721)  cefTRIAXone (ROCEPHIN) 1 g in dextrose 5 % 50 mL IVPB (1 g Intravenous New Bag/Given 06/20/16 1719)     Initial Impression / Assessment and Plan / ED Course  I have reviewed the triage vital signs and the nursing notes.  Pertinent labs & imaging  results that were available during my care of the patient were reviewed by me and considered in my medical decision making (see chart for details).  Clinical Course   26 her old male with a history of atrial fibrillation, hypertension, hyperlipidemia, CVA  presents with concern for altered mental status, with delirium and generalized weakness. Son reports he is not using his right arm as mentioned he goes from sitting to standing. On my neurologic exam, he has normal proximal strength, normal grip strength, however limitation with flexion and extension of the elbow, and reports pain as a limiting factor. Given pain with movement in this area, have low suspicion these symptoms represent CVA, and suspect other elbow joint pathology. X-ray was done which showed no sign of fracture. There is a very small effusion, however given no significant pain with passive range of motion, no erythema, do not suspect septic arthritis.  CT head was done which was within normal limits. Chest x-ray shows no sign of pneumonia or congestive heart failure. Labs without significant abnormalities.   Patient's suprapubic catheter which was in place for 3 weeks, was changed at bedside by me, and fresh sample showed return of purulent urine, and given symptoms of altered mental status, mild suprapubic pain, and signs of UTI, favor UTI as etiology of patient's symptoms. Given IV Rocephin, and admitted to the hospitalist for further care.  Patient with hematuria yesterday and this AM which initially cleared and then returned late in his ED stay.    Suspect UTI as etiology of generalized weakness and will admit for further care.  Final Clinical Impressions(s) / ED Diagnoses   Final diagnoses:  Acute cystitis with hematuria  Confusion    New Prescriptions New Prescriptions   No medications on file     Gareth Morgan, MD 06/20/16 1807

## 2016-06-20 NOTE — ED Notes (Signed)
Bed: WA02 Expected date:  Expected time:  Means of arrival:  Comments: 53M/AMS

## 2016-06-20 NOTE — Progress Notes (Signed)
EDCM went to speak to patient at bedside, however, patient is asleep. Patient's POA [employer]. At bedside [phone] Patient lives at home with his wife. Patient's son reports patient has 24/7 in home aide care seven days a week. Patient is active with [facility] for suprapubic tube management. Patient has a walker at home. Patient's son reports patient is usually able to perform his ADL's with assistance, but hasn't been able to do much lately. No further EDCM needs at this time.

## 2016-06-20 NOTE — ED Notes (Signed)
Respiratory called and responded. Oxygen dropping to low 80s. Patient does have low perfusion.  Placed on 2L of O2.  Oxygen now 96.

## 2016-06-20 NOTE — ED Notes (Signed)
Pt can go up at 18:25.

## 2016-06-21 DIAGNOSIS — Z823 Family history of stroke: Secondary | ICD-10-CM | POA: Diagnosis not present

## 2016-06-21 DIAGNOSIS — E785 Hyperlipidemia, unspecified: Secondary | ICD-10-CM | POA: Diagnosis present

## 2016-06-21 DIAGNOSIS — F05 Delirium due to known physiological condition: Secondary | ICD-10-CM

## 2016-06-21 DIAGNOSIS — N390 Urinary tract infection, site not specified: Principal | ICD-10-CM

## 2016-06-21 DIAGNOSIS — N400 Enlarged prostate without lower urinary tract symptoms: Secondary | ICD-10-CM | POA: Diagnosis present

## 2016-06-21 DIAGNOSIS — I480 Paroxysmal atrial fibrillation: Secondary | ICD-10-CM | POA: Diagnosis present

## 2016-06-21 DIAGNOSIS — Z882 Allergy status to sulfonamides status: Secondary | ICD-10-CM | POA: Diagnosis not present

## 2016-06-21 DIAGNOSIS — M109 Gout, unspecified: Secondary | ICD-10-CM | POA: Diagnosis present

## 2016-06-21 DIAGNOSIS — F0390 Unspecified dementia without behavioral disturbance: Secondary | ICD-10-CM | POA: Diagnosis present

## 2016-06-21 DIAGNOSIS — G9340 Encephalopathy, unspecified: Secondary | ICD-10-CM | POA: Diagnosis present

## 2016-06-21 DIAGNOSIS — E860 Dehydration: Secondary | ICD-10-CM

## 2016-06-21 DIAGNOSIS — Z7982 Long term (current) use of aspirin: Secondary | ICD-10-CM | POA: Diagnosis not present

## 2016-06-21 DIAGNOSIS — Z79899 Other long term (current) drug therapy: Secondary | ICD-10-CM | POA: Diagnosis not present

## 2016-06-21 DIAGNOSIS — I10 Essential (primary) hypertension: Secondary | ICD-10-CM | POA: Diagnosis present

## 2016-06-21 DIAGNOSIS — I69320 Aphasia following cerebral infarction: Secondary | ICD-10-CM | POA: Diagnosis not present

## 2016-06-21 DIAGNOSIS — E876 Hypokalemia: Secondary | ICD-10-CM | POA: Diagnosis present

## 2016-06-21 DIAGNOSIS — Z66 Do not resuscitate: Secondary | ICD-10-CM | POA: Diagnosis present

## 2016-06-21 DIAGNOSIS — R4182 Altered mental status, unspecified: Secondary | ICD-10-CM | POA: Diagnosis present

## 2016-06-21 LAB — CBC
HCT: 36.5 % — ABNORMAL LOW (ref 39.0–52.0)
Hemoglobin: 12.1 g/dL — ABNORMAL LOW (ref 13.0–17.0)
MCH: 28.6 pg (ref 26.0–34.0)
MCHC: 33.2 g/dL (ref 30.0–36.0)
MCV: 86.3 fL (ref 78.0–100.0)
PLATELETS: 159 10*3/uL (ref 150–400)
RBC: 4.23 MIL/uL (ref 4.22–5.81)
RDW: 14.8 % (ref 11.5–15.5)
WBC: 4.7 10*3/uL (ref 4.0–10.5)

## 2016-06-21 LAB — BASIC METABOLIC PANEL
Anion gap: 6 (ref 5–15)
BUN: 34 mg/dL — AB (ref 6–20)
CALCIUM: 8.6 mg/dL — AB (ref 8.9–10.3)
CHLORIDE: 112 mmol/L — AB (ref 101–111)
CO2: 23 mmol/L (ref 22–32)
CREATININE: 0.98 mg/dL (ref 0.61–1.24)
Glucose, Bld: 89 mg/dL (ref 65–99)
Potassium: 3.2 mmol/L — ABNORMAL LOW (ref 3.5–5.1)
SODIUM: 141 mmol/L (ref 135–145)

## 2016-06-21 MED ORDER — HYDRALAZINE HCL 20 MG/ML IJ SOLN
10.0000 mg | Freq: Once | INTRAMUSCULAR | Status: AC
  Administered 2016-06-21: 10 mg via INTRAVENOUS
  Filled 2016-06-21: qty 1

## 2016-06-21 MED ORDER — POTASSIUM CHLORIDE CRYS ER 20 MEQ PO TBCR
40.0000 meq | EXTENDED_RELEASE_TABLET | Freq: Once | ORAL | Status: AC
  Administered 2016-06-21: 40 meq via ORAL
  Filled 2016-06-21: qty 2

## 2016-06-21 MED ORDER — MUPIROCIN 2 % EX OINT
1.0000 "application " | TOPICAL_OINTMENT | Freq: Two times a day (BID) | CUTANEOUS | Status: DC
  Administered 2016-06-21 – 2016-06-22 (×4): 1 via NASAL
  Filled 2016-06-21: qty 22

## 2016-06-21 MED ORDER — SODIUM CHLORIDE 0.9 % IV SOLN
INTRAVENOUS | Status: DC
  Administered 2016-06-21 (×2): via INTRAVENOUS

## 2016-06-21 MED ORDER — SODIUM CHLORIDE 0.9 % IV BOLUS (SEPSIS)
500.0000 mL | Freq: Once | INTRAVENOUS | Status: AC
  Administered 2016-06-21: 500 mL via INTRAVENOUS

## 2016-06-21 MED ORDER — CHLORHEXIDINE GLUCONATE CLOTH 2 % EX PADS
6.0000 | MEDICATED_PAD | Freq: Every day | CUTANEOUS | Status: DC
  Administered 2016-06-22: 6 via TOPICAL

## 2016-06-21 NOTE — Evaluation (Signed)
Physical Therapy Evaluation Patient Details Name: Charles [name] MRN: [id number] DOB: Apr 07, 1918 Today's Date: 06/21/2016   History of Present Illness  Charles [name] is a 82 y.o. male with medical history significant of a-fib, HTN, CVA, prostate enlargement, suprapubic catheter who presents for a few days of confusion. Found  to have pus in catheter bag.   Clinical Impression  The patient  Aroused and  Was assisted to sitting at bed edge and pivot to recliner. Son present for information re: Prior function and reports that the plan will be to return to [facility] with 24/7 caregiver. Pt admitted with above diagnosis. Pt currently with functional limitations due to the deficits listed below (see PT Problem List). Pt will benefit from skilled PT to increase their independence and safety with mobility to allow discharge to the venue listed below.       Follow Up Recommendations Home health PT;Supervision/Assistance - 24 hour    Equipment Recommendations  None recommended by PT    Recommendations for Other Services       Precautions / Restrictions Precautions Precautions: Fall Precaution Comments: suprapubic catheter      Mobility  Bed Mobility Overal bed mobility: Needs Assistance Bed Mobility: Sidelying to Sit   Sidelying to sit: Mod assist       General bed mobility comments: extra time and assist to scoot to bed edge, weight shift  Transfers Overall transfer level: Needs assistance Equipment used: Rolling walker (2 wheeled) Transfers: Sit to/from [facility] Sit to Stand: Mod assist            Ambulation/Gait                Stairs            Wheelchair Mobility    Modified Rankin (Stroke Patients Only)       Balance                                             Pertinent Vitals/Pain Pain Assessment: No/denies pain    Home Living Family/patient expects to be discharged to:: Private residence Living  Arrangements: Spouse/significant other Available Help at Discharge: Family;Personal care attendant;Available 24 hours/day Type of Home: Apartment Home Access: Level entry     Home Layout: One level Home Equipment: Walker - 4 wheels Additional Comments: he and wife have indep living with 24/7 caregivers    Prior Function Level of Independence: Needs assistance   Gait / Transfers Assistance Needed: distant supervision, per son gets up with RW, walks to dining room 3 x's day, naps  after each meal. No physical assistance was needed.            Hand Dominance        Extremity/Trunk Assessment   Upper Extremity Assessment: Generalized weakness           Lower Extremity Assessment: Generalized weakness      Cervical / Trunk Assessment: Kyphotic  Communication      Cognition Arousal/Alertness: Awake/alert   Overall Cognitive Status: Impaired/Different from baseline Area of Impairment: Following commands       Following Commands: Follows one step commands with increased time       General Comments: patient follows  tactile cues and gestures.    General Comments      Exercises        Assessment/Plan    PT  Assessment Patient needs continued PT services  PT Diagnosis Difficulty walking;Generalized weakness   PT Problem List Decreased strength;Decreased range of motion;Decreased activity tolerance;Decreased mobility;Decreased balance;Decreased knowledge of use of DME;Decreased safety awareness;Decreased cognition;Decreased knowledge of precautions  PT Treatment Interventions DME instruction;Gait training;Functional mobility training;Therapeutic activities;Patient/family education   PT Goals (Current goals can be found in the Care Plan section) Acute Rehab PT Goals Patient Stated Goal: per son, return to independent living PT Goal Formulation: With family Time For Goal Achievement: 07/05/16 Potential to Achieve Goals: Good    Frequency Min 3X/week    Barriers to discharge        Co-evaluation               End of Session   Activity Tolerance: Patient limited by fatigue;Patient tolerated treatment well Patient left: in chair;with call bell/phone within reach;with chair alarm set;with family/visitor present Nurse Communication: Mobility status    Functional Assessment Tool Used: clinical judgement Functional Limitation: Mobility: Walking and moving around Mobility: Walking and Moving Around Current Status [id number]): At least 40 percent but less than 60 percent impaired, limited or restricted Mobility: Walking and Moving Around Goal Status [phone]): At least 1 percent but less than 20 percent impaired, limited or restricted    Time: [id number] PT Time Calculation (min) (ACUTE ONLY): 30 min   Charges:         PT G Codes:   PT G-Codes **NOT FOR INPATIENT CLASS** Functional Assessment Tool Used: clinical judgement Functional Limitation: Mobility: Walking and moving around Mobility: Walking and Moving Around Current Status [id number]): At least 40 percent but less than 60 percent impaired, limited or restricted Mobility: Walking and Moving Around Goal Status [phone]): At least 1 percent but less than 20 percent impaired, limited or restricted    Claretha Cooper 06/21/2016, 5:11 PM Tresa Endo PT [phone]

## 2016-06-21 NOTE — Progress Notes (Signed)
PROGRESS NOTE    Charles [name]  [id number] DOB: 05/30/1918 DOA: 06/20/2016 PCP: Reymundo Poll, MD   Brief Narrative:  Charles [name] is a 82 year old gentleman currently residing in the community, having home aide care 7 days a week, having a history of suprapubic catheter, presented to the emergency department on 06/20/2016 with acute encephalopathy. Lab work revealed urinary tract infection as he was started on empiric IV antibiotic therapy with ceftriaxone.   Assessment & Plan:   Principal Problem:   UTI (lower urinary tract infection) Active Problems:   Paroxysmal atrial fibrillation (HCC)   Dehydration   Delirium due to another medical condition   1.  Functional decline/acute encephalopathy -Charles [name] is a pleasant 82 year old gentleman currently residing in the community presenting with a steep functional decline in the last 24 hours, workup showing urinary tract infection -During my evaluation he remains confused, sleepy, unable to provide history -Continue gentle IV fluid hydration along with empiric IV antibiotic therapy, physical therapy consultation  2.  Urinary tract infection -He has a history of suprapubic catheter placement -Presented with steep functional decline, workup daily UA that showed many bacteria, moderate leukocytes, nursing staff reporting urine appeared very cloudy -Both urine and blood cultures are pending at the time of this dictation -Continue empiric IV antibiotic therapy with ceftriaxone  3.  Hypokalemia -Lab work showing potassium of 3.2 -Will replace with 40 mEq of Kdur  4.  History of paroxysmal atrial fibrillation -He is not anticoagulated -Continue aspirin and bisoprolol   DVT prophylaxis: SCD's Code Status: DO NOT RESUSCITATE Family Communication:  Disposition Plan:   Consultants:   Physical therapy  Procedures:     Antimicrobials:   Ceftriaxone    Subjective: Patient is confused, disoriented, cannot provide  history  Objective: Vitals:   06/20/16 1538 06/20/16 1944 06/21/16 0505 06/21/16 1402  BP:  (!) 178/78 (!) 174/86 (!) 155/80  Pulse: (!) 55 84 64 61  Resp: 22 (!) 22 20 20   Temp:  97.4 F (36.3 C) 97.8 F (36.6 C)   TempSrc:  Axillary Axillary   SpO2: 95% 95% 97% 95%  Weight:   66.8 kg (147 lb 4.8 oz)   Height:        Intake/Output Summary (Last 24 hours) at 06/21/16 1535 Last data filed at 06/21/16 1353  Gross per 24 hour  Intake                0 ml  Output             1095 ml  Net            -1095 ml   Filed Weights   06/20/16 1312 06/21/16 0505  Weight: 68 kg (150 lb) 66.8 kg (147 lb 4.8 oz)    Examination:  General exam: Encephalopathic, confused disoriented Respiratory system: Clear to auscultation. Respiratory effort normal. Cardiovascular system: S1 & S2 heard, RRR. No JVD, murmurs, rubs, gallops or clicks. No pedal edema. Gastrointestinal system: Abdomen is nondistended, soft and nontender. No organomegaly or masses felt. Normal bowel sounds heard. Central nervous system: Nonfocal Extremities: Symmetric 5 x 5 power. Skin: Muscle atrophy present bilaterally Psychiatry: Cephalopathic    Data Reviewed: I have personally reviewed following labs and imaging studies  CBC:  Recent Labs Lab 06/20/16 1324 06/21/16 0522  WBC 5.8 4.7  NEUTROABS 4.7  --   HGB 12.8* 12.1*  HCT 39.5 36.5*  MCV 86.4 86.3  PLT 158 [id number]   Basic Metabolic Panel:  Recent Labs Lab  06/20/16 1324 06/21/16 0522  NA 142 141  K 3.5 3.2*  CL 110 112*  CO2 25 23  GLUCOSE 112* 89  BUN 37* 34*  CREATININE 1.10 0.98  CALCIUM 8.9 8.6*  MG 2.0  --    GFR: Estimated Creatinine Clearance: 41.7 mL/min (by C-G formula based on SCr of 0.98 mg/dL). Liver Function Tests:  Recent Labs Lab 06/20/16 1324  AST 52*  ALT 68*  ALKPHOS 155*  BILITOT 1.8*  PROT 6.5  ALBUMIN 3.1*   No results for input(s): LIPASE, AMYLASE in the last 168 hours. No results for input(s): AMMONIA in the  last 168 hours. Coagulation Profile: No results for input(s): INR, PROTIME in the last 168 hours. Cardiac Enzymes: No results for input(s): CKTOTAL, CKMB, CKMBINDEX, TROPONINI in the last 168 hours. BNP (last 3 results) No results for input(s): PROBNP in the last 8760 hours. HbA1C: No results for input(s): HGBA1C in the last 72 hours. CBG: No results for input(s): GLUCAP in the last 168 hours. Lipid Profile: No results for input(s): CHOL, HDL, LDLCALC, TRIG, CHOLHDL, LDLDIRECT in the last 72 hours. Thyroid Function Tests: No results for input(s): TSH, T4TOTAL, FREET4, T3FREE, THYROIDAB in the last 72 hours. Anemia Panel: No results for input(s): VITAMINB12, FOLATE, FERRITIN, TIBC, IRON, RETICCTPCT in the last 72 hours. Sepsis Labs:  Recent Labs Lab 06/20/16 1333 06/20/16 1710  LATICACIDVEN 1.78 1.35    Recent Results (from the past 240 hour(s))  Urine culture     Status: None (Preliminary result)   Collection Time: 06/20/16  1:02 PM  Result Value Ref Range Status   Specimen Description URINE, RANDOM  Final   Special Requests NONE  Final   Culture   Final    CULTURE REINCUBATED FOR BETTER GROWTH Performed at [facility]    Report Status PENDING  Incomplete  Blood culture (routine x 2)     Status: None (Preliminary result)   Collection Time: 06/20/16  1:25 PM  Result Value Ref Range Status   Specimen Description BLOOD BLOOD RIGHT FOREARM  Final   Special Requests BOTTLES DRAWN AEROBIC AND ANAEROBIC 10ML  Final   Culture   Final    NO GROWTH < 24 HOURS Performed at [facility]    Report Status PENDING  Incomplete  Blood culture (routine x 2)     Status: None (Preliminary result)   Collection Time: 06/20/16  2:17 PM  Result Value Ref Range Status   Specimen Description BLOOD LEFT HAND  Final   Special Requests BOTTLES DRAWN AEROBIC AND ANAEROBIC 6ML  Final   Culture   Final    NO GROWTH < 24 HOURS Performed at [facility]    Report Status  PENDING  Incomplete  MRSA PCR Screening     Status: Abnormal   Collection Time: 06/20/16  8:08 PM  Result Value Ref Range Status   MRSA by PCR POSITIVE (A) NEGATIVE Final    Comment:        The GeneXpert MRSA Assay (FDA approved for NASAL specimens only), is one component of a comprehensive MRSA colonization surveillance program. It is not intended to diagnose MRSA infection nor to guide or monitor treatment for MRSA infections. RESULT CALLED TO, READ BACK BY AND VERIFIED WITH: K.BOOKER,RN [id number] 06/20/16 W.SHEA          Radiology Studies: Dg Chest 2 View  Result Date: 06/20/2016 CLINICAL DATA:  Altered mental status EXAM: CHEST  2 VIEW COMPARISON:  11/09/2015 FINDINGS: COPD with  hyperinflation of lungs. There is apical scarring bilaterally. Pleural thickening posteriorly on the right is unchanged. Negative for pneumonia. Negative for heart failure. Heart size within normal limits. IMPRESSION: COPD with scarring.  No change from the prior study. Electronically Signed   By: Franchot Gallo M.D.   On: 06/20/2016 14:05   Dg Elbow Complete Right  Result Date: 06/20/2016 CLINICAL DATA:  Altered mental status. Right elbow pain for 3 days. No known injury. EXAM: RIGHT ELBOW - COMPLETE 3+ VIEW COMPARISON:  None. FINDINGS: No fracture or dislocation is identified. A small elbow joint effusion is questioned. No destructive lesion or osseous erosion. Soft tissue thickening posterior to the olecranon. IV cannula in place in the forearm. IMPRESSION: Possible small elbow joint effusion. No acute osseous abnormality identified. Electronically Signed   By: Logan Bores M.D.   On: 06/20/2016 14:03   Ct Head Wo Contrast  Result Date: 06/20/2016 CLINICAL DATA:  Dementia patient with decreased level of consciousness for 3 days. EXAM: CT HEAD WITHOUT CONTRAST TECHNIQUE: Contiguous axial images were obtained from the base of the skull through the vertex without intravenous contrast. COMPARISON:  None. Cuts  pleasant FINDINGS: No evidence for acute infarction, hemorrhage, mass lesion, or extra-axial fluid. Global atrophy. Hydrocephalus ex vacuo. Chronic lacunar infarcts affect the BILATERAL basal ganglia, and LEFT thalamus. Remote cortical infarct LEFT frontal region. Vascular calcification. Prominent Pacchionian granulations with resultant calvarial remodeling. No skull fracture. No sinus or mastoid disease. IMPRESSION: Chronic changes as described. No acute intracranial findings. Electronically Signed   By: Staci Righter M.D.   On: 06/20/2016 13:54        Scheduled Meds: . aspirin EC  81 mg Oral Daily  . atorvastatin  40 mg Oral Daily  . bisoprolol  7.5 mg Oral Daily  . cefTRIAXone (ROCEPHIN)  IV  1 g Intravenous Q24H  . Chlorhexidine Gluconate Cloth  6 each Topical Q0600  . enoxaparin (LOVENOX) injection  40 mg Subcutaneous Q24H  . gabapentin  100 mg Oral QHS  . mirabegron ER  50 mg Oral Daily  . mupirocin ointment  1 application Nasal BID   Continuous Infusions: . sodium chloride 75 mL/hr at 06/21/16 1146     LOS: 0 days    Time spent: 35 min    Kelvin Cellar, MD [facility] Pager [phone]  If 7PM-7AM, please contact night-coverage [email or web address] Password [email or web address] 06/21/2016, 3:35 PM

## 2016-06-22 LAB — CBC
HCT: 40.3 % (ref 39.0–52.0)
Hemoglobin: 13.1 g/dL (ref 13.0–17.0)
MCH: 27.8 pg (ref 26.0–34.0)
MCHC: 32.5 g/dL (ref 30.0–36.0)
MCV: 85.6 fL (ref 78.0–100.0)
PLATELETS: 184 10*3/uL (ref 150–400)
RBC: 4.71 MIL/uL (ref 4.22–5.81)
RDW: 14.5 % (ref 11.5–15.5)
WBC: 5.1 10*3/uL (ref 4.0–10.5)

## 2016-06-22 LAB — BASIC METABOLIC PANEL
Anion gap: 7 (ref 5–15)
BUN: 31 mg/dL — AB (ref 6–20)
CALCIUM: 8.6 mg/dL — AB (ref 8.9–10.3)
CHLORIDE: 113 mmol/L — AB (ref 101–111)
CO2: 21 mmol/L — ABNORMAL LOW (ref 22–32)
CREATININE: 0.87 mg/dL (ref 0.61–1.24)
GFR calc Af Amer: >60 mL/min (ref >60)
Glucose, Bld: 88 mg/dL (ref 65–99)
Potassium: 4 mmol/L (ref 3.5–5.1)
SODIUM: 141 mmol/L (ref 135–145)

## 2016-06-22 LAB — URINE CULTURE

## 2016-06-22 MED ORDER — CEFUROXIME AXETIL 500 MG PO TABS
500.0000 mg | ORAL_TABLET | Freq: Two times a day (BID) | ORAL | Status: DC
  Filled 2016-06-22: qty 1

## 2016-06-22 MED ORDER — CEFUROXIME AXETIL 500 MG PO TABS: 500.0000 mg | ORAL_TABLET | Freq: Two times a day (BID) | ORAL | 0 refills | Status: AC

## 2016-06-22 NOTE — Progress Notes (Signed)
Pt will discharge with [facility] for HHRN/PT. Pt from [place] with 24/7 care, son will transport pt back to ALF.

## 2016-06-22 NOTE — Discharge Summary (Signed)
Physician Discharge Summary  Jadiel Farb [id number] DOB: May 17, 1918 DOA: 06/20/2016  PCP: Reymundo Poll, MD  Admit date: 06/20/2016 Discharge date: 06/22/2016  Time spent: 35 minutes  Recommendations for Outpatient Follow-up:  1. [name] was admitted and treated for urinary tract infection, showed clinical improvement with ceftriaxone, discharged on Ceftin, to receive a total of 7 days of empiric antibiotic therapy 2. Home health services were resumed on discharge   Discharge Diagnoses:  Principal Problem:   UTI (lower urinary tract infection) Active Problems:   Paroxysmal atrial fibrillation ([place])   Dehydration   Delirium due to another medical condition   Discharge Condition: Improved  Diet recommendation: As tolerated  Filed Weights   06/20/16 1312 06/21/16 0505  Weight: 68 kg (150 lb) 66.8 kg (147 lb 4.8 oz)    History of present illness:  Charles [name] is a 82 y.o. male with medical history significant of a-fib, HTN, CVA, prostate enlargement, suprapubic catheter who presents for a few days of confusion. History is provided by son who states that his father is usually independent but is confused now. He has not had a UTI in about 3 months. He also had some pain in his right elbow recently but no reason for this pain has been found.    Hospital Course:  Mr. mix is a pleasant 82 year old gentleman who currently resides at home with his spouse, presented with significant mental status changes having initial workup revealing a urinary tract infection. He was started on IV fluids and IV antibiotics and subsequently showed significant clinical improvement. By 06/22/2016 his son who was present at bedside reported that patient was functioning at his baseline level. His son felt that he would probably do much better at home in his familiar environment with his wife. He was discharged home on 06/22/2016.  1.  Functional decline/acute encephalopathy -Mr Charles [name] is a pleasant  82 year old gentleman currently residing in the community presenting with a steep functional decline in the last 24 hours, workup showing urinary tract infection -At receiving IV fluids and IV antibiotics he show significant clinical improvement  2.  Urinary tract infection -He has a history of suprapubic catheter placement -Presented with steep functional decline, workup daily UA that showed many bacteria, moderate leukocytes, nursing staff reporting urine appeared very cloudy -During this hospitalization history with ceftriaxone, showing clinical improvement, was discharged on Ceftin 500 mg by mouth twice a day or a total of 7 days of antibiotic therapy  3.  Hypokalemia -Lab work showing potassium of 3.2 -Repeat labs on 06/22/2069 showed improved potassium 4.0  4.  History of paroxysmal atrial fibrillation -He is not anticoagulated -Continue aspirin and bisoprolol   Discharge Exam: Vitals:   06/22/16 0652 06/22/16 1222  BP: (!) 135/93 (!) 136/50  Pulse: 63 74  Resp: 20 18  Temp: 97.8 F (36.6 C) 98.9 F (37.2 C)    General: On my assessment today he looks much better, more interactive, awake and alert Cardiovascular: Regular rate and rhythm normal S1-S2 Respiratory: Normal respiratory effort, lungs are clear to auscultation Abdomen: Soft nontender nondistended  Discharge Instructions   Discharge Instructions    Call MD for:    Complete by:  As directed   Call MD for:  difficulty breathing, headache or visual disturbances    Complete by:  As directed   Call MD for:  extreme fatigue    Complete by:  As directed   Call MD for:  hives    Complete by:  As directed  Call MD for:  persistant dizziness or light-headedness    Complete by:  As directed   Call MD for:  persistant nausea and vomiting    Complete by:  As directed   Call MD for:  redness, tenderness, or signs of infection (pain, swelling, redness, odor or green/yellow discharge around incision site)    Complete by:   As directed   Call MD for:  severe uncontrolled pain    Complete by:  As directed   Call MD for:  temperature >100.4    Complete by:  As directed   Diet - low sodium heart healthy    Complete by:  As directed   Increase activity slowly    Complete by:  As directed     Current Discharge Medication List    START taking these medications   Details  cefUROXime (CEFTIN) 500 MG tablet Take 1 tablet (500 mg total) by mouth 2 (two) times daily with a meal. Qty: 10 tablet, Refills: 0      CONTINUE these medications which have NOT CHANGED   Details  aspirin EC 81 MG tablet Take 81 mg by mouth daily.    atorvastatin (LIPITOR) 40 MG tablet Take 1 tablet (40 mg total) by mouth daily. Qty: 30 tablet, Refills: 0    bisoprolol (ZEBETA) 5 MG tablet Take 7.5 mg by mouth daily.    gabapentin (NEURONTIN) 100 MG capsule Take 100 mg by mouth at bedtime.     mirabegron ER (MYRBETRIQ) 25 MG TB24 tablet Take 50 mg by mouth daily.     OVER THE COUNTER MEDICATION Take 1 tablet by mouth daily. Medication: D-mannose with cranberry       STOP taking these medications     phenazopyridine (PYRIDIUM) 100 MG tablet        Allergies  Allergen Reactions  . Bactrim [Sulfamethoxazole-Trimethoprim] Other (See Comments)    Bowel problems    Follow-up Information    Reymundo Poll, MD Follow up in 2 week(s).   Specialty:  Family Medicine Contact information: [address] [place] [address] [phone]            The results of significant diagnostics from this hospitalization (including imaging, microbiology, ancillary and laboratory) are listed below for reference.    Significant Diagnostic Studies: Dg Chest 2 View  Result Date: 06/20/2016 CLINICAL DATA:  Altered mental status EXAM: CHEST  2 VIEW COMPARISON:  11/09/2015 FINDINGS: COPD with hyperinflation of lungs. There is apical scarring bilaterally. Pleural thickening posteriorly on the right is unchanged. Negative for  pneumonia. Negative for heart failure. Heart size within normal limits. IMPRESSION: COPD with scarring.  No change from the prior study. Electronically Signed   By: Franchot Gallo M.D.   On: 06/20/2016 14:05   Dg Elbow Complete Right  Result Date: 06/20/2016 CLINICAL DATA:  Altered mental status. Right elbow pain for 3 days. No known injury. EXAM: RIGHT ELBOW - COMPLETE 3+ VIEW COMPARISON:  None. FINDINGS: No fracture or dislocation is identified. A small elbow joint effusion is questioned. No destructive lesion or osseous erosion. Soft tissue thickening posterior to the olecranon. IV cannula in place in the forearm. IMPRESSION: Possible small elbow joint effusion. No acute osseous abnormality identified. Electronically Signed   By: Logan Bores M.D.   On: 06/20/2016 14:03   Ct Head Wo Contrast  Result Date: 06/20/2016 CLINICAL DATA:  Dementia patient with decreased level of consciousness for 3 days. EXAM: CT HEAD WITHOUT CONTRAST TECHNIQUE: Contiguous axial  images were obtained from the base of the skull through the vertex without intravenous contrast. COMPARISON:  None. Cuts pleasant FINDINGS: No evidence for acute infarction, hemorrhage, mass lesion, or extra-axial fluid. Global atrophy. Hydrocephalus ex vacuo. Chronic lacunar infarcts affect the BILATERAL basal ganglia, and LEFT thalamus. Remote cortical infarct LEFT frontal region. Vascular calcification. Prominent Pacchionian granulations with resultant calvarial remodeling. No skull fracture. No sinus or mastoid disease. IMPRESSION: Chronic changes as described. No acute intracranial findings. Electronically Signed   By: Staci Righter M.D.   On: 06/20/2016 13:54    Microbiology: Recent Results (from the past 240 hour(s))  Urine culture     Status: Abnormal   Collection Time: 06/20/16  1:02 PM  Result Value Ref Range Status   Specimen Description URINE, RANDOM  Final   Special Requests NONE  Final   Culture MULTIPLE SPECIES PRESENT, SUGGEST  RECOLLECTION (A)  Final   Report Status 06/22/2016 FINAL  Final  Blood culture (routine x 2)     Status: None (Preliminary result)   Collection Time: 06/20/16  1:25 PM  Result Value Ref Range Status   Specimen Description BLOOD BLOOD RIGHT FOREARM  Final   Special Requests BOTTLES DRAWN AEROBIC AND ANAEROBIC 10ML  Final   Culture   Final    NO GROWTH 2 DAYS Performed at [facility]    Report Status PENDING  Incomplete  Blood culture (routine x 2)     Status: None (Preliminary result)   Collection Time: 06/20/16  2:17 PM  Result Value Ref Range Status   Specimen Description BLOOD LEFT HAND  Final   Special Requests BOTTLES DRAWN AEROBIC AND ANAEROBIC 6ML  Final   Culture   Final    NO GROWTH 2 DAYS Performed at [facility]    Report Status PENDING  Incomplete  MRSA PCR Screening     Status: Abnormal   Collection Time: 06/20/16  8:08 PM  Result Value Ref Range Status   MRSA by PCR POSITIVE (A) NEGATIVE Final    Comment:        The GeneXpert MRSA Assay (FDA approved for NASAL specimens only), is one component of a comprehensive MRSA colonization surveillance program. It is not intended to diagnose MRSA infection nor to guide or monitor treatment for MRSA infections. RESULT CALLED TO, READ BACK BY AND VERIFIED WITH: K.BOOKER,RN 2343 06/20/16 W.SHEA      Labs: Basic Metabolic Panel:  Recent Labs Lab 06/20/16 1324 06/21/16 0522 06/22/16 0556  NA 142 141 141  K 3.5 3.2* 4.0  CL 110 112* 113*  CO2 25 23 21*  GLUCOSE 112* 89 88  BUN 37* 34* 31*  CREATININE 1.10 0.98 0.87  CALCIUM 8.9 8.6* 8.6*  MG 2.0  --   --    Liver Function Tests:  Recent Labs Lab 06/20/16 1324  AST 52*  ALT 68*  ALKPHOS 155*  BILITOT 1.8*  PROT 6.5  ALBUMIN 3.1*   No results for input(s): LIPASE, AMYLASE in the last 168 hours. No results for input(s): AMMONIA in the last 168 hours. CBC:  Recent Labs Lab 06/20/16 1324 06/21/16 0522 06/22/16 0556  WBC 5.8 4.7 5.1   NEUTROABS 4.7  --   --   HGB 12.8* 12.1* 13.1  HCT 39.5 36.5* 40.3  MCV 86.4 86.3 85.6  PLT 158 159 184   Cardiac Enzymes: No results for input(s): CKTOTAL, CKMB, CKMBINDEX, TROPONINI in the last 168 hours. BNP: BNP (last 3 results) No results for input(s):  BNP in the last 8760 hours.  ProBNP (last 3 results) No results for input(s): PROBNP in the last 8760 hours.  CBG: No results for input(s): GLUCAP in the last 168 hours.     Signed:  Kelvin Cellar MD.  [facility] 06/22/2016, 1:45 PM

## 2016-06-25 LAB — CULTURE, BLOOD (ROUTINE X 2)
CULTURE: NO GROWTH
Culture: NO GROWTH

## 2016-07-25 ENCOUNTER — Other Ambulatory Visit: Payer: Self-pay | Admitting: Geriatric Medicine

## 2016-07-27 ENCOUNTER — Other Ambulatory Visit: Payer: Self-pay | Admitting: Geriatric Medicine

## 2016-09-05 ENCOUNTER — Emergency Department (HOSPITAL_COMMUNITY)
Admission: EM | Admit: 2016-09-05 | Discharge: 2016-09-06 | Disposition: A | Discharge status: Home / Self Care | Payer: PPO | Attending: Emergency Medicine | Admitting: Emergency Medicine

## 2016-09-05 ENCOUNTER — Encounter (HOSPITAL_COMMUNITY): Payer: Self-pay | Admitting: Emergency Medicine

## 2016-09-05 ENCOUNTER — Emergency Department (HOSPITAL_COMMUNITY): Payer: PPO

## 2016-09-05 DIAGNOSIS — Z79899 Other long term (current) drug therapy: Secondary | ICD-10-CM | POA: Insufficient documentation

## 2016-09-05 DIAGNOSIS — R339 Retention of urine, unspecified: Secondary | ICD-10-CM

## 2016-09-05 DIAGNOSIS — R197 Diarrhea, unspecified: Secondary | ICD-10-CM | POA: Insufficient documentation

## 2016-09-05 DIAGNOSIS — K5909 Other constipation: Secondary | ICD-10-CM | POA: Diagnosis not present

## 2016-09-05 DIAGNOSIS — I10 Essential (primary) hypertension: Secondary | ICD-10-CM | POA: Diagnosis not present

## 2016-09-05 DIAGNOSIS — N390 Urinary tract infection, site not specified: Secondary | ICD-10-CM | POA: Insufficient documentation

## 2016-09-05 DIAGNOSIS — Z7982 Long term (current) use of aspirin: Secondary | ICD-10-CM | POA: Diagnosis not present

## 2016-09-05 DIAGNOSIS — N289 Disorder of kidney and ureter, unspecified: Secondary | ICD-10-CM | POA: Diagnosis not present

## 2016-09-05 LAB — CBC WITH DIFFERENTIAL/PLATELET
Basophils Absolute: 0 10*3/uL (ref 0.0–0.1)
Basophils Relative: 0 %
EOS ABS: 0.1 10*3/uL (ref 0.0–0.7)
Eosinophils Relative: 2 %
HEMATOCRIT: 38.1 % — AB (ref 39.0–52.0)
HEMOGLOBIN: 12.4 g/dL — AB (ref 13.0–17.0)
LYMPHS ABS: 0.9 10*3/uL (ref 0.7–4.0)
LYMPHS PCT: 10 %
MCH: 27.9 pg (ref 26.0–34.0)
MCHC: 32.5 g/dL (ref 30.0–36.0)
MCV: 85.8 fL (ref 78.0–100.0)
MONOS PCT: 8 %
Monocytes Absolute: 0.7 10*3/uL (ref 0.1–1.0)
NEUTROS PCT: 80 %
Neutro Abs: 7 10*3/uL (ref 1.7–7.7)
Platelets: 163 10*3/uL (ref 150–400)
RBC: 4.44 MIL/uL (ref 4.22–5.81)
RDW: 15.3 % (ref 11.5–15.5)
WBC: 8.8 10*3/uL (ref 4.0–10.5)

## 2016-09-05 LAB — URINALYSIS, ROUTINE W REFLEX MICROSCOPIC
BILIRUBIN URINE: NEGATIVE
GLUCOSE, UA: NEGATIVE mg/dL
KETONES UR: NEGATIVE mg/dL
Nitrite: POSITIVE — AB
PROTEIN: 100 mg/dL — AB
Specific Gravity, Urine: 1.017 (ref 1.005–1.030)
pH: 6 (ref 5.0–8.0)

## 2016-09-05 LAB — COMPREHENSIVE METABOLIC PANEL
ALK PHOS: 81 U/L (ref 38–126)
ALT: 12 U/L — AB (ref 17–63)
ANION GAP: 7 (ref 5–15)
AST: 17 U/L (ref 15–41)
Albumin: 3.4 g/dL — ABNORMAL LOW (ref 3.5–5.0)
BILIRUBIN TOTAL: 1.3 mg/dL — AB (ref 0.3–1.2)
BUN: 36 mg/dL — ABNORMAL HIGH (ref 6–20)
CALCIUM: 8.8 mg/dL — AB (ref 8.9–10.3)
CO2: 22 mmol/L (ref 22–32)
CREATININE: 1.5 mg/dL — AB (ref 0.61–1.24)
Chloride: 111 mmol/L (ref 101–111)
GFR, EST AFRICAN AMERICAN: 43 mL/min — AB (ref >60)
GFR, EST NON AFRICAN AMERICAN: 38 mL/min — AB (ref >60)
Glucose, Bld: 114 mg/dL — ABNORMAL HIGH (ref 65–99)
Potassium: 3.8 mmol/L (ref 3.5–5.1)
SODIUM: 140 mmol/L (ref 135–145)
TOTAL PROTEIN: 6.3 g/dL — AB (ref 6.5–8.1)

## 2016-09-05 LAB — URINE MICROSCOPIC-ADD ON

## 2016-09-05 MED ORDER — FLEET ENEMA 7-19 GM/118ML RE ENEM
1.0000 | ENEMA | Freq: Once | RECTAL | Status: AC
  Administered 2016-09-06: 1 via RECTAL
  Filled 2016-09-05: qty 1

## 2016-09-05 NOTE — ED Notes (Signed)
Bed: [id number] Expected date:  Expected time:  Means of arrival:  Comments: 82 yo M  Dark urine, altered mental status

## 2016-09-05 NOTE — ED Triage Notes (Addendum)
Per EMS, pt from [facility] independent living, c/o urinary retention, hx UTI and dementia. Per nurse that stays with patient, patient is more altered.

## 2016-09-05 NOTE — ED Notes (Signed)
Patient transported to X-ray 

## 2016-09-06 MED ORDER — CEPHALEXIN 500 MG PO CAPS: 500.0000 mg | ORAL_CAPSULE | Freq: Three times a day (TID) | ORAL | 0 refills | Status: AC

## 2016-09-06 MED ORDER — CEPHALEXIN 500 MG PO CAPS
500.0000 mg | ORAL_CAPSULE | Freq: Once | ORAL | Status: AC
  Administered 2016-09-06: 500 mg via ORAL
  Filled 2016-09-06: qty 1

## 2016-09-06 NOTE — Discharge Instructions (Signed)
Take keflex three times daily for a week.   Stay hydrated.   Continue miralax for constipation.   See urology.   Repeat renal function test in a week   Return to ER if he has fever, vomiting, severe abdominal pain, foley not draining.

## 2016-09-06 NOTE — ED Provider Notes (Signed)
[facility] DEPT Provider Note   CSN: [id number] Arrival date & time: 09/05/16  2109     History   Chief Complaint Chief Complaint  Patient presents with  . Urinary Retention    HPI Charles [name] is a 82 y.o. male hx of afib not on coumadin, prostate cancer and bladder cancer with indwelling suprapubic catheter here with urinary retention, loose stools. Patient is from an independent living facility and since yesterday, patient has been unable to urinate. As per the son, there is urine draining around the Foley not inside the Foley. The Foley was changed several weeks ago and he usually gets it changed monthly. He is still slightly more confused than usual but baseline demented. Per the son, he is chronically constipated but has several loose stools in the past few days. He required enemas in the past.    The history is provided by the patient and a relative.   Level V caveat- dementia   Past Medical History:  Diagnosis Date  . A-fib ([place])   . Gout   . Hyperlipidemia   . Hypertension   . Insomnia   . Prostate enlargement   . Stroke [facility])    no residual weakness    Patient Active Problem List   Diagnosis Date Noted  . Delirium due to another medical condition 06/20/2016  . Cerebrovascular accident (CVA) due to embolism of cerebral artery ([place]) 12/01/2015  . Dehydration 12/01/2015  . Absolute anemia   . UTI (lower urinary tract infection)   . Anemia, unspecified   . Syncope and collapse   . Urinary tract infection, site not specified   . Syncope 11/09/2015  . Faintness   . Suprapubic catheter ([place]) 08/27/2015  . History of stroke 08/27/2015  . Stroke ([place])   . Paroxysmal atrial fibrillation ([facility])   . HLD (hyperlipidemia)   . CVA (cerebral infarction) 07/28/2015    Past Surgical History:  Procedure Laterality Date  . SUPRAPUBIC CATHETER INSERTION         Home Medications    Prior to Admission medications   Medication Sig Start Date End Date Taking?  Authorizing Provider  aspirin EC 81 MG tablet Take 81 mg by mouth daily.   Yes Historical Provider, MD  bisoprolol (ZEBETA) 5 MG tablet Take 7.5 mg by mouth daily.   Yes Historical Provider, MD  mirabegron ER (MYRBETRIQ) 25 MG TB24 tablet Take 50 mg by mouth daily.    Yes Historical Provider, MD  OVER THE COUNTER MEDICATION Take 1 tablet by mouth daily. Medication: D-mannose with cranberry    Yes Historical Provider, MD  temazepam (RESTORIL) 15 MG capsule Take 15 mg by mouth at bedtime as needed for sleep.   Yes Historical Provider, MD  atorvastatin (LIPITOR) 40 MG tablet Take 1 tablet (40 mg total) by mouth daily. Patient not taking: Reported on 09/05/2016 07/31/15   Dellia Nims, MD  cefUROXime (CEFTIN) 500 MG tablet Take 1 tablet (500 mg total) by mouth 2 (two) times daily with a meal. Patient not taking: Reported on 09/05/2016 06/22/16   Kelvin Cellar, MD    Family History Family History  Problem Relation Age of Onset  . Stroke Father     Social History Social History  Substance Use Topics  . Smoking status: Never Smoker  . Smokeless tobacco: Never Used  . Alcohol use No     Allergies   Bactrim [sulfamethoxazole-trimethoprim]   Review of Systems Review of Systems  Gastrointestinal: Positive for diarrhea.  Genitourinary: Positive for  difficulty urinating.  All other systems reviewed and are negative.    Physical Exam Updated Vital Signs BP 115/60 (BP Location: Left Arm)   Pulse 62   Temp 98.1 F (36.7 C) (Oral)   Resp 20   Ht 6\' 1"  (1.854 m)   Wt 150 lb (68 kg)   SpO2 94%   BMI 19.79 kg/m   Physical Exam  Constitutional: He is oriented to person, place, and time.  Chronically ill, slightly uncomfortable   HENT:  Head: Normocephalic.  Eyes: Pupils are equal, round, and reactive to light.  Neck: Normal range of motion. Neck supple.  Cardiovascular: Normal rate, regular rhythm and normal heart sounds.   Pulmonary/Chest: Effort normal and breath sounds  normal. No respiratory distress. He has no wheezes. He has no rales.  Abdominal: Soft. Bowel sounds are normal.  + suprapubic catheter clotted, + suprapubic tenderness   Musculoskeletal: Normal range of motion.  Neurological: He is alert and oriented to person, place, and time.  Skin: Skin is warm.  Nursing note and vitals reviewed.    ED Treatments / Results  Labs (all labs ordered are listed, but only abnormal results are displayed) Labs Reviewed  CBC WITH DIFFERENTIAL/PLATELET - Abnormal; Notable for the following:       Result Value   Hemoglobin 12.4 (*)    HCT 38.1 (*)    All other components within normal limits  COMPREHENSIVE METABOLIC PANEL - Abnormal; Notable for the following:    Glucose, Bld 114 (*)    BUN 36 (*)    Creatinine, Ser 1.50 (*)    Calcium 8.8 (*)    Total Protein 6.3 (*)    Albumin 3.4 (*)    ALT 12 (*)    Total Bilirubin 1.3 (*)    GFR calc non Af Amer 38 (*)    GFR calc Af Amer 43 (*)    All other components within normal limits  URINALYSIS, ROUTINE W REFLEX MICROSCOPIC (NOT AT [facility]) - Abnormal; Notable for the following:    Color, Urine AMBER (*)    APPearance TURBID (*)    Hgb urine dipstick LARGE (*)    Protein, ur 100 (*)    Nitrite POSITIVE (*)    Leukocytes, UA LARGE (*)    All other components within normal limits  URINE MICROSCOPIC-ADD ON - Abnormal; Notable for the following:    Squamous Epithelial / LPF 0-5 (*)    Bacteria, UA MANY (*)    All other components within normal limits  URINE CULTURE    EKG  EKG Interpretation None       Radiology Dg Abdomen Acute W/chest  Result Date: 09/05/2016 CLINICAL DATA:  Acute onset of generalized abdominal pain. Urinary tract infection. Initial encounter. EXAM: DG ABDOMEN ACUTE W/ 1V CHEST COMPARISON:  Chest radiograph performed 06/20/2016, and CT of the abdomen and pelvis performed 02/11/2016 FINDINGS: The lungs are well-aerated. Mild bilateral scarring is noted. There is no evidence of  focal opacification, pleural effusion or pneumothorax. The cardiomediastinal silhouette is borderline normal in size. Densities overlying the midlung zones appear to reflect overlying skin folds. The visualized bowel gas pattern is unremarkable. Scattered stool and air are seen within the colon; there is no evidence of small bowel dilatation to suggest obstruction. No free intra-abdominal air is identified on the provided decubitus view. No acute osseous abnormalities are seen; the sacroiliac joints are unremarkable in appearance. An abdominal wall mesh is noted overlying the left hemipelvis. IMPRESSION: 1. Mild bilateral  scarring noted.  Lungs otherwise grossly clear. 2. Unremarkable bowel gas pattern; no free intra-abdominal air seen. Moderate amount of stool noted in the colon. Electronically Signed   By: Garald Balding M.D.   On: 09/05/2016 23:19    Procedures SUPRAPUBIC TUBE PLACEMENT Date/Time: 09/06/2016 12:23 AM Performed by: Shirlyn Goltz HSIENTA Authorized by: Drenda Freeze   Consent:    Consent obtained:  Verbal   Consent given by:  Patient   Risks discussed:  Pain   Alternatives discussed:  No treatment Anesthesia (see MAR for exact dosages):    Anesthesia method:  None Procedure details:    Complexity:  Simple   Catheter size:  18 Fr   Ultrasound guidance: no     Number of attempts:  1   Urine characteristics:  Mildly cloudy Post-procedure details:    Patient tolerance of procedure:  Tolerated well, no immediate complications   (including critical care time)  Medications Ordered in ED Medications  sodium phosphate (FLEET) 7-19 GM/118ML enema 1 enema (not administered)  cephALEXin (KEFLEX) capsule 500 mg (500 mg Oral Given 09/06/16 0016)     Initial Impression / Assessment and Plan / ED Course  I have reviewed the triage vital signs and the nursing notes.  Pertinent labs & imaging results that were available during my care of the patient were reviewed by me and  considered in my medical decision making (see chart for details).  Clinical Course    Jarvis Yochum is a 82 y.o. male here with clotted suprapubic catheter, loose stools. Will exchange the catheter. Will get xrays since he has hx of constipation. Abdomen nontender other than suprapubic tenderness. Will get labs, UA.   12:24 AM Cr. 1.5 likely post renal obstruction. Foley draining cloudy urine. UA + WBC and nitrate but not sure if its colonized vs infected. Urine culture sent. Just finished cipro so will try keflex. Xray showed constipation and hx of the same so will give enema. Vitals stable. Will dc home with keflex, urology follow up.    Final Clinical Impressions(s) / ED Diagnoses   Final diagnoses:  None    New Prescriptions New Prescriptions   No medications on file     Drenda Freeze, MD 09/06/16 [phone]

## 2016-09-09 LAB — URINE CULTURE: Culture: >=100000 — AB

## 2016-09-10 ENCOUNTER — Telehealth (HOSPITAL_BASED_OUTPATIENT_CLINIC_OR_DEPARTMENT_OTHER): Payer: Self-pay

## 2016-09-10 NOTE — Telephone Encounter (Signed)
Pt Urine culture called for change in ABX to Septra. Son states that PCP placed on Bactrim 2 days ago.

## 2016-09-10 NOTE — Progress Notes (Signed)
ED Antimicrobial Stewardship Positive Culture Follow Up   Charles [name] is an 82 y.o. male who presented to [facility] on 09/05/2016 with a chief complaint of  Chief Complaint  Patient presents with  . Urinary Retention    Recent Results (from the past 720 hour(s))  Urine culture     Status: Abnormal   Collection Time: 09/05/16 11:04 PM  Result Value Ref Range Status   Specimen Description URINE, SUPRAPUBIC  Final   Special Requests NONE  Final   Culture (A)  Final    >=100,000 COLONIES/mL METHICILLIN RESISTANT STAPHYLOCOCCUS AUREUS   Report Status 09/09/2016 FINAL  Final   Organism ID, Bacteria METHICILLIN RESISTANT STAPHYLOCOCCUS AUREUS (A)  Final      Susceptibility   Methicillin resistant staphylococcus aureus - MIC*    CIPROFLOXACIN >=8 RESISTANT Resistant     GENTAMICIN <=0.5 SENSITIVE Sensitive     NITROFURANTOIN <=16 SENSITIVE Sensitive     OXACILLIN >=4 RESISTANT Resistant     TETRACYCLINE <=1 SENSITIVE Sensitive     VANCOMYCIN 1 SENSITIVE Sensitive     TRIMETH/SULFA <=10 SENSITIVE Sensitive     CLINDAMYCIN <=0.25 RESISTANT Resistant     RIFAMPIN <=0.5 SENSITIVE Sensitive     Inducible Clindamycin POSITIVE Resistant     * >=100,000 COLONIES/mL METHICILLIN RESISTANT STAPHYLOCOCCUS AUREUS    [x]  Treated with keflex, organism resistant to prescribed antimicrobial  Pt with hx bladder/prostate CA. Dc keflex and use septra instead due to MRSA  New antibiotic prescription:   Dc keflex  Septra SS 1 BID x 7days  ED Provider: Cathi Roan, [place]  Onnie Boer, PharmD Pager: [phone] Infectious Diseases Pharmacist Phone# [phone]

## 2016-09-26 ENCOUNTER — Emergency Department (HOSPITAL_COMMUNITY): Payer: PPO

## 2016-09-26 ENCOUNTER — Encounter (HOSPITAL_COMMUNITY): Payer: Self-pay | Admitting: Emergency Medicine

## 2016-09-26 ENCOUNTER — Emergency Department (HOSPITAL_COMMUNITY)
Admission: EM | Admit: 2016-09-26 | Discharge: 2016-09-26 | Disposition: A | Discharge status: Home / Self Care | Payer: PPO | Attending: Emergency Medicine | Admitting: Emergency Medicine

## 2016-09-26 DIAGNOSIS — R4182 Altered mental status, unspecified: Secondary | ICD-10-CM | POA: Insufficient documentation

## 2016-09-26 DIAGNOSIS — R531 Weakness: Secondary | ICD-10-CM | POA: Insufficient documentation

## 2016-09-26 DIAGNOSIS — I10 Essential (primary) hypertension: Secondary | ICD-10-CM | POA: Diagnosis not present

## 2016-09-26 DIAGNOSIS — Z79899 Other long term (current) drug therapy: Secondary | ICD-10-CM | POA: Insufficient documentation

## 2016-09-26 DIAGNOSIS — N390 Urinary tract infection, site not specified: Secondary | ICD-10-CM

## 2016-09-26 DIAGNOSIS — T83511A Infection and inflammatory reaction due to indwelling urethral catheter, initial encounter: Secondary | ICD-10-CM | POA: Diagnosis not present

## 2016-09-26 DIAGNOSIS — Z7982 Long term (current) use of aspirin: Secondary | ICD-10-CM | POA: Diagnosis not present

## 2016-09-26 DIAGNOSIS — Y733 Surgical instruments, materials and gastroenterology and urology devices (including sutures) associated with adverse incidents: Secondary | ICD-10-CM | POA: Diagnosis not present

## 2016-09-26 DIAGNOSIS — Z8673 Personal history of transient ischemic attack (TIA), and cerebral infarction without residual deficits: Secondary | ICD-10-CM | POA: Insufficient documentation

## 2016-09-26 LAB — URINE MICROSCOPIC-ADD ON

## 2016-09-26 LAB — URINALYSIS, ROUTINE W REFLEX MICROSCOPIC
Bilirubin Urine: NEGATIVE
GLUCOSE, UA: NEGATIVE mg/dL
Ketones, ur: NEGATIVE mg/dL
Nitrite: POSITIVE — AB
Protein, ur: 100 mg/dL — AB
SPECIFIC GRAVITY, URINE: 1.027 (ref 1.005–1.030)
pH: 7 (ref 5.0–8.0)

## 2016-09-26 LAB — COMPREHENSIVE METABOLIC PANEL
ALBUMIN: 3.1 g/dL — AB (ref 3.5–5.0)
ALK PHOS: 82 U/L (ref 38–126)
ALT: 14 U/L — ABNORMAL LOW (ref 17–63)
AST: 21 U/L (ref 15–41)
Anion gap: 7 (ref 5–15)
BILIRUBIN TOTAL: 1.1 mg/dL (ref 0.3–1.2)
BUN: 28 mg/dL — AB (ref 6–20)
CALCIUM: 9.1 mg/dL (ref 8.9–10.3)
CO2: 24 mmol/L (ref 22–32)
Chloride: 108 mmol/L (ref 101–111)
Creatinine, Ser: 1.32 mg/dL — ABNORMAL HIGH (ref 0.61–1.24)
GFR calc Af Amer: 51 mL/min — ABNORMAL LOW (ref >60)
GFR calc non Af Amer: 44 mL/min — ABNORMAL LOW (ref >60)
GLUCOSE: 79 mg/dL (ref 65–99)
Potassium: 3.4 mmol/L — ABNORMAL LOW (ref 3.5–5.1)
Sodium: 139 mmol/L (ref 135–145)
TOTAL PROTEIN: 6.1 g/dL — AB (ref 6.5–8.1)

## 2016-09-26 LAB — CBC WITH DIFFERENTIAL/PLATELET
BASOS ABS: 0 10*3/uL (ref 0.0–0.1)
BASOS PCT: 0 %
Eosinophils Absolute: 0.1 10*3/uL (ref 0.0–0.7)
Eosinophils Relative: 3 %
HEMATOCRIT: 40.5 % (ref 39.0–52.0)
HEMOGLOBIN: 12.8 g/dL — AB (ref 13.0–17.0)
Lymphocytes Relative: 16 %
Lymphs Abs: 0.8 10*3/uL (ref 0.7–4.0)
MCH: 26.9 pg (ref 26.0–34.0)
MCHC: 31.6 g/dL (ref 30.0–36.0)
MCV: 85.1 fL (ref 78.0–100.0)
MONOS PCT: 14 %
Monocytes Absolute: 0.7 10*3/uL (ref 0.1–1.0)
NEUTROS ABS: 3.4 10*3/uL (ref 1.7–7.7)
NEUTROS PCT: 67 %
Platelets: 141 10*3/uL — ABNORMAL LOW (ref 150–400)
RBC: 4.76 MIL/uL (ref 4.22–5.81)
RDW: 14.6 % (ref 11.5–15.5)
WBC: 5 10*3/uL (ref 4.0–10.5)

## 2016-09-26 LAB — I-STAT CG4 LACTIC ACID, ED: Lactic Acid, Venous: 1.67 mmol/L (ref 0.5–1.9)

## 2016-09-26 LAB — PROTIME-INR
INR: 1.11
Prothrombin Time: 14.3 seconds (ref 11.4–15.2)

## 2016-09-26 MED ORDER — NITROFURANTOIN MONOHYD MACRO 100 MG PO CAPS
100.0000 mg | ORAL_CAPSULE | Freq: Once | ORAL | Status: AC
  Administered 2016-09-26: 100 mg via ORAL
  Filled 2016-09-26: qty 1

## 2016-09-26 MED ORDER — VANCOMYCIN HCL IN DEXTROSE 1-5 GM/200ML-% IV SOLN
1000.0000 mg | Freq: Once | INTRAVENOUS | Status: AC
Start: 2016-09-26 — End: 2016-09-26
  Administered 2016-09-26: 1000 mg via INTRAVENOUS
  Filled 2016-09-26: qty 200

## 2016-09-26 MED ORDER — NITROFURANTOIN MONOHYD MACRO 100 MG PO CAPS: 100.0000 mg | ORAL_CAPSULE | Freq: Two times a day (BID) | ORAL | 0 refills | Status: AC

## 2016-09-26 NOTE — ED Notes (Signed)
Pt verbalized understanding discharge instructions and denies any further needs or questions at this time. VS stable 

## 2016-09-26 NOTE — ED Triage Notes (Signed)
Pt in from [place] via [facility] with BLE weakness, confusion and L sided weakness since 7:30pm last night. Hx of CVA x 2 with residual R side wkns and aphasia, Afib, dementia. Hx of SPC with frequent infections, last changed 3 wks ago, finished Bactrim course. Aide noticed pus and blood in SPC tubing today.

## 2016-09-26 NOTE — ED Notes (Signed)
A11 sheet sent down for 18Fr. Foley catheter so MD may switch out pt's current suprapubic catheter.

## 2016-09-26 NOTE — ED Notes (Addendum)
Patient verbalized understanding of discharge instructions and denies any further needs or questions at this time. VS stable. Patient ambulatory with steady gait and 2 x assist. Escorted pt to ED entrance in wheelchair, where he was helped into his son's car and taken home.

## 2016-09-26 NOTE — ED Notes (Signed)
Suprapubic catheter switched out for new one (18Fr.) by Dr. Gilford Raid. Urine return noted. Site clean, intact without redness or edema. New standard drainage leg bag attached.

## 2016-09-26 NOTE — ED Provider Notes (Addendum)
[facility] DEPT Provider Note   CSN: [id number] Arrival date & time: 09/26/16  1128     History   Chief Complaint Chief Complaint  Patient presents with  . Stroke Symptoms  . Weakness  . Altered Mental Status    HPI Charles [name] is a 82 y.o. male.  Pt presents to the ED today with altered mental status.  Pt has a hx of CVA with right sided weakness.  The son said that he used to walk with his walker, but for the last 2 weeks, he has had to have more help.  The pt's son noticed that he has left sided weakness now, where he used to just have right sided weakness.  The son said pt ate breakfast this morning w/o problems.  The pt also has a hx of an indwelling suprapubic catheter and frequently gets UTIs.  He was in the ED on 10/17 and was diagnosed with an uti.  Pt given rx for keflex and urine culture sent.  Pt also recently on bactrim.      Past Medical History:  Diagnosis Date  . A-fib ([place])   . Gout   . Hyperlipidemia   . Hypertension   . Insomnia   . Prostate enlargement   . Stroke [facility])    no residual weakness    Patient Active Problem List   Diagnosis Date Noted  . Delirium due to another medical condition 06/20/2016  . Cerebrovascular accident (CVA) due to embolism of cerebral artery ([place]) 12/01/2015  . Dehydration 12/01/2015  . Absolute anemia   . UTI (lower urinary tract infection)   . Anemia, unspecified   . Syncope and collapse   . Urinary tract infection, site not specified   . Syncope 11/09/2015  . Faintness   . Suprapubic catheter ([place]) 08/27/2015  . History of stroke 08/27/2015  . Stroke ([place])   . Paroxysmal atrial fibrillation ([facility])   . HLD (hyperlipidemia)   . CVA (cerebral infarction) 07/28/2015    Past Surgical History:  Procedure Laterality Date  . SUPRAPUBIC CATHETER INSERTION         Home Medications    Prior to Admission medications   Medication Sig Start Date End Date Taking? Authorizing Provider  aspirin EC 81 MG tablet  Take 81 mg by mouth daily.   Yes Historical Provider, MD  bisoprolol (ZEBETA) 5 MG tablet Take 7.5 mg by mouth daily.   Yes Historical Provider, MD  mirabegron ER (MYRBETRIQ) 25 MG TB24 tablet Take 50 mg by mouth daily.    Yes Historical Provider, MD  OVER THE COUNTER MEDICATION Take 1 tablet by mouth daily. Medication: D-mannose with cranberry    Yes Historical Provider, MD  temazepam (RESTORIL) 15 MG capsule Take 15 mg by mouth at bedtime as needed for sleep.   Yes Historical Provider, MD  atorvastatin (LIPITOR) 40 MG tablet Take 1 tablet (40 mg total) by mouth daily. Patient not taking: Reported on 09/26/2016 07/31/15   Dellia Nims, MD  cefUROXime (CEFTIN) 500 MG tablet Take 1 tablet (500 mg total) by mouth 2 (two) times daily with a meal. Patient not taking: Reported on 09/26/2016 06/22/16   Kelvin Cellar, MD  cephALEXin (KEFLEX) 500 MG capsule Take 1 capsule (500 mg total) by mouth 3 (three) times daily. Patient not taking: Reported on 09/26/2016 09/06/16   Drenda Freeze, MD  nitrofurantoin, macrocrystal-monohydrate, (MACROBID) 100 MG capsule Take 1 capsule (100 mg total) by mouth 2 (two) times daily. 09/26/16   Isla Pence,  MD    Family History Family History  Problem Relation Age of Onset  . Stroke Father     Social History Social History  Substance Use Topics  . Smoking status: Never Smoker  . Smokeless tobacco: Never Used  . Alcohol use No     Allergies   Patient has no active allergies.   Review of Systems Review of Systems  Unable to perform ROS: Dementia  All other systems reviewed and are negative.    Physical Exam Updated Vital Signs BP 164/81 (BP Location: Right Arm)   Pulse 68   Temp 98.2 F (36.8 C) (Rectal)   Resp 19   Ht 5\' 10"  (1.778 m)   Wt 150 lb (68 kg)   SpO2 96%   BMI 21.52 kg/m   Physical Exam  Constitutional: He appears well-developed and well-nourished.  HENT:  Head: Normocephalic and atraumatic.  Right Ear: External ear normal.    Left Ear: External ear normal.  Nose: Nose normal.  Mouth/Throat: Oropharynx is clear and moist.  Eyes: Conjunctivae and EOM are normal. Pupils are equal, round, and reactive to light.  Neck: Normal range of motion. Neck supple.  Cardiovascular: Normal rate, regular rhythm, normal heart sounds and intact distal pulses.   Pulmonary/Chest: Effort normal and breath sounds normal.  Abdominal: Soft. Bowel sounds are normal.  Musculoskeletal:  Pain with right shoulder movement.  Neurological: He is alert.  Pt has a hx of prior stroke with chronic right upper and lower leg weakness.  Pt has baseline aphasia.  Pt is moving left arm and leg well.  Nursing note and vitals reviewed.    ED Treatments / Results  Labs (all labs ordered are listed, but only abnormal results are displayed) Labs Reviewed  CBC WITH DIFFERENTIAL/PLATELET - Abnormal; Notable for the following:       Result Value   Hemoglobin 12.8 (*)    Platelets 141 (*)    All other components within normal limits  COMPREHENSIVE METABOLIC PANEL - Abnormal; Notable for the following:    Potassium 3.4 (*)    BUN 28 (*)    Creatinine, Ser 1.32 (*)    Total Protein 6.1 (*)    Albumin 3.1 (*)    ALT 14 (*)    GFR calc non Af Amer 44 (*)    GFR calc Af Amer 51 (*)    All other components within normal limits  URINALYSIS, ROUTINE W REFLEX MICROSCOPIC (NOT AT [facility]) - Abnormal; Notable for the following:    APPearance TURBID (*)    Hgb urine dipstick LARGE (*)    Protein, ur 100 (*)    Nitrite POSITIVE (*)    Leukocytes, UA LARGE (*)    All other components within normal limits  URINE MICROSCOPIC-ADD ON - Abnormal; Notable for the following:    Squamous Epithelial / LPF 0-5 (*)    Bacteria, UA MANY (*)    Crystals CA OXALATE CRYSTALS (*)    All other components within normal limits  CULTURE, BLOOD (ROUTINE X 2)  CULTURE, BLOOD (ROUTINE X 2)  URINE CULTURE  PROTIME-INR  I-STAT CG4 LACTIC ACID, ED    EKG  EKG  Interpretation None       Radiology Dg Chest 2 View  Result Date: 09/26/2016 CLINICAL DATA:  Altered mental status.  Recent falls. EXAM: CHEST  2 VIEW COMPARISON:  PA and lateral chest 06/20/2016. FINDINGS: Left basilar subsegmental atelectasis is seen. No consolidative process or pneumothorax. Small left effusion is noted.  There is cardiomegaly without edema. Atherosclerosis is noted. IMPRESSION: Small left pleural effusion. Cardiomegaly without edema. Atherosclerosis. Electronically Signed   By: Inge Rise M.D.   On: 09/26/2016 12:40   Dg Shoulder Right  Result Date: 09/26/2016 CLINICAL DATA:  Right shoulder pain due to a recent fall. Initial encounter. EXAM: RIGHT SHOULDER - 2+ VIEW COMPARISON:  None. FINDINGS: There is no evidence of fracture or dislocation. There is no evidence of arthropathy or other focal bone abnormality. Soft tissues are unremarkable. IMPRESSION: Negative exam. Electronically Signed   By: Inge Rise M.D.   On: 09/26/2016 12:40   Ct Head Wo Contrast  Result Date: 09/26/2016 CLINICAL DATA:  Mental status changes today. Bilateral leg weakness. EXAM: CT HEAD WITHOUT CONTRAST TECHNIQUE: Contiguous axial images were obtained from the base of the skull through the vertex without intravenous contrast. COMPARISON:  06/20/2016 FINDINGS: Brain: Stable age related cerebral atrophy, ventriculomegaly and periventricular white matter disease. Stable remote lacunar type basal ganglia infarcts and remote probable watershed infarct on the left side. No extra-axial fluid collections are identified. No CT findings for acute hemispheric infarction or intracranial hemorrhage. No mass lesions. The brainstem and cerebellum are normal. Vascular: Stable vascular calcifications. No definite aneurysm or hyperdense vessels. Skull: No skull fracture or bone lesions. Sinuses/Orbits: The paranasal sinuses mastoid air cells are clear. The globes are intact. Other: No scalp lesions or hematoma.  IMPRESSION: 1. Stable age related cerebral atrophy, ventriculomegaly and periventricular white matter disease. 2. Remote infarcts.  No acute infarction or intracranial hemorrhage. Electronically Signed   By: Marijo Sanes M.D.   On: 09/26/2016 12:14    Procedures SUPRAPUBIC TUBE PLACEMENT Date/Time: 09/26/2016 4:14 PM Performed by: Isla Pence Authorized by: Isla Pence   Consent:    Consent obtained:  Verbal   Alternatives discussed:  No treatment Anesthesia (see MAR for exact dosages):    Anesthesia method:  None Comments:     Old catheter removed per son's request and new 46F catheter placed in tunnel.  10 cc of NS placed in balloon.   (including critical care time)  Medications Ordered in ED Medications  vancomycin (VANCOCIN) IVPB 1000 mg/200 mL premix (not administered)  nitrofurantoin (macrocrystal-monohydrate) (MACROBID) capsule 100 mg (not administered)     Initial Impression / Assessment and Plan / ED Course  I have reviewed the triage vital signs and the nursing notes.  Pertinent labs & imaging results that were available during my care of the patient were reviewed by me and considered in my medical decision making (see chart for details).  Clinical Course    Pt's suprapubic catheter changed.  He was given a dose of vancomycin and macrobid.  Urine culture a few weeks ago sensitive to both.  As pt was on bactrim recently, I will send pt home on macrobid.  Pt's son feels comfortable for pt to go back to where he is currently living.  Final Clinical Impressions(s) / ED Diagnoses   Final diagnoses:  Weakness  Urinary tract infection associated with indwelling urethral catheter, initial encounter ([place])    New Prescriptions New Prescriptions   NITROFURANTOIN, MACROCRYSTAL-MONOHYDRATE, (MACROBID) 100 MG CAPSULE    Take 1 capsule (100 mg total) by mouth 2 (two) times daily.     Isla Pence, MD 09/26/16 1513    Isla Pence, MD 09/26/16 [phone]

## 2016-09-28 LAB — URINE CULTURE

## 2016-09-29 ENCOUNTER — Telehealth (HOSPITAL_BASED_OUTPATIENT_CLINIC_OR_DEPARTMENT_OTHER): Payer: Self-pay

## 2016-09-29 NOTE — Telephone Encounter (Signed)
Post ED Visit - Positive Culture Follow-up  Culture report reviewed by antimicrobial stewardship pharmacist:  []  Elenor Quinones, Pharm.D. []  Heide Guile, Pharm.D., BCPS []  Parks Neptune, Pharm.D. []  Alycia Rossetti, Pharm.D., BCPS []  [place], Pharm.D., BCPS, AAHIVP []  Legrand Como, Pharm.D., BCPS, AAHIVP []  Milus Glazier, Pharm.D. []  Stephens November, Pharm.D. Apryl Lacretia Leigh D Positive urine culture Treated with Nitrofurantoin Monohyd Macro, organism sensitive to the same and no further patient follow-up is required at this time.  Genia Del 09/29/2016, 9:50 AM

## 2016-09-29 NOTE — Progress Notes (Signed)
ED Antimicrobial Stewardship Positive Culture Follow Up   Charles [name] is an 82 y.o. male who presented to [facility] on 09/26/2016 with a chief complaint of  Chief Complaint  Patient presents with  . Stroke Symptoms  . Weakness  . Altered Mental Status    Recent Results (from the past 720 hour(s))  Urine culture     Status: Abnormal   Collection Time: 09/05/16 11:04 PM  Result Value Ref Range Status   Specimen Description URINE, SUPRAPUBIC  Final   Special Requests NONE  Final   Culture (A)  Final    >=100,000 COLONIES/mL METHICILLIN RESISTANT STAPHYLOCOCCUS AUREUS   Report Status 09/09/2016 FINAL  Final   Organism ID, Bacteria METHICILLIN RESISTANT STAPHYLOCOCCUS AUREUS (A)  Final      Susceptibility   Methicillin resistant staphylococcus aureus - MIC*    CIPROFLOXACIN >=8 RESISTANT Resistant     GENTAMICIN <=0.5 SENSITIVE Sensitive     NITROFURANTOIN <=16 SENSITIVE Sensitive     OXACILLIN >=4 RESISTANT Resistant     TETRACYCLINE <=1 SENSITIVE Sensitive     VANCOMYCIN 1 SENSITIVE Sensitive     TRIMETH/SULFA <=10 SENSITIVE Sensitive     CLINDAMYCIN <=0.25 RESISTANT Resistant     RIFAMPIN <=0.5 SENSITIVE Sensitive     Inducible Clindamycin POSITIVE Resistant     * >=100,000 COLONIES/mL METHICILLIN RESISTANT STAPHYLOCOCCUS AUREUS  Blood Cultures (routine x 2)     Status: None (Preliminary result)   Collection Time: 09/26/16 12:43 PM  Result Value Ref Range Status   Specimen Description BLOOD RIGHT FOREARM  Final   Special Requests BOTTLES DRAWN AEROBIC ONLY  5CC  Final   Culture NO GROWTH 2 DAYS  Final   Report Status PENDING  Incomplete  Blood Cultures (routine x 2)     Status: None (Preliminary result)   Collection Time: 09/26/16 12:54 PM  Result Value Ref Range Status   Specimen Description BLOOD RIGHT ANTECUBITAL  Final   Special Requests BOTTLES DRAWN AEROBIC ONLY  [place]  Final   Culture NO GROWTH 2 DAYS  Final   Report Status PENDING  Incomplete  Urine culture      Status: Abnormal   Collection Time: 09/26/16  2:12 PM  Result Value Ref Range Status   Specimen Description URINE, SUPRAPUBIC  Final   Special Requests NONE  Final   Culture (A)  Final    >=100,000 COLONIES/mL METHICILLIN RESISTANT STAPHYLOCOCCUS AUREUS >=100,000 COLONIES/mL DIPHTHEROIDS(CORYNEBACTERIUM SPECIES) Standardized susceptibility testing for this organism is not available.    Report Status 09/28/2016 FINAL  Final   Organism ID, Bacteria METHICILLIN RESISTANT STAPHYLOCOCCUS AUREUS (A)  Final      Susceptibility   Methicillin resistant staphylococcus aureus - MIC*    CIPROFLOXACIN >=8 RESISTANT Resistant     GENTAMICIN <=0.5 SENSITIVE Sensitive     NITROFURANTOIN <=16 SENSITIVE Sensitive     OXACILLIN >=4 RESISTANT Resistant     TETRACYCLINE <=1 SENSITIVE Sensitive     VANCOMYCIN 1 SENSITIVE Sensitive     TRIMETH/SULFA <=10 SENSITIVE Sensitive     CLINDAMYCIN <=0.25 RESISTANT Resistant     RIFAMPIN <=0.5 SENSITIVE Sensitive     Inducible Clindamycin POSITIVE Resistant     * >=100,000 COLONIES/mL METHICILLIN RESISTANT STAPHYLOCOCCUS AUREUS    [x]  Patient re-presents for MRSA UTI complicated by a suprapubic catheter. The catheter was changed and the organism is sensitive to the discharge antibiotic (Macrobid). Spoke with PA, we agree that continuation of Macrobid is appropriate at this time.   ED Provider: Claris Pong  Law, PA  Belia Heman, PharmD PGY1 Pharmacy Resident [phone] (Pager) 09/29/2016 8:56 AM

## 2016-10-01 LAB — CULTURE, BLOOD (ROUTINE X 2)
Culture: NO GROWTH
Culture: NO GROWTH

## 2016-11-20 DEATH — deceased

## 2018-05-22 IMAGING — CR DG ELBOW COMPLETE 3+V*R*
4 series · 4 of 4 positions shown · non-contrast
Comparison: None.

CLINICAL DATA: Altered mental status. Right elbow pain for 3 days.
No known injury.

EXAM:
RIGHT ELBOW - COMPLETE 3+ VIEW

[x elbow ap right]
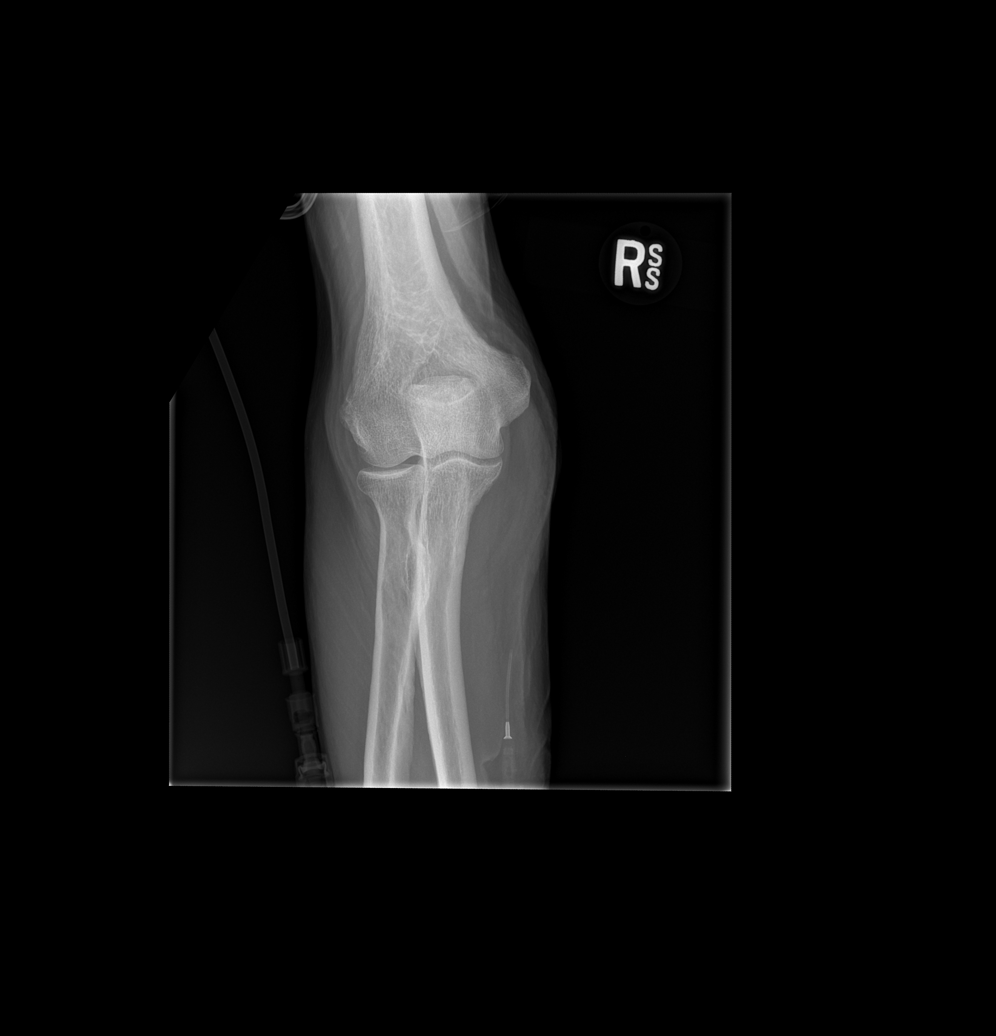

[x elbow obl right (1 of 2)]
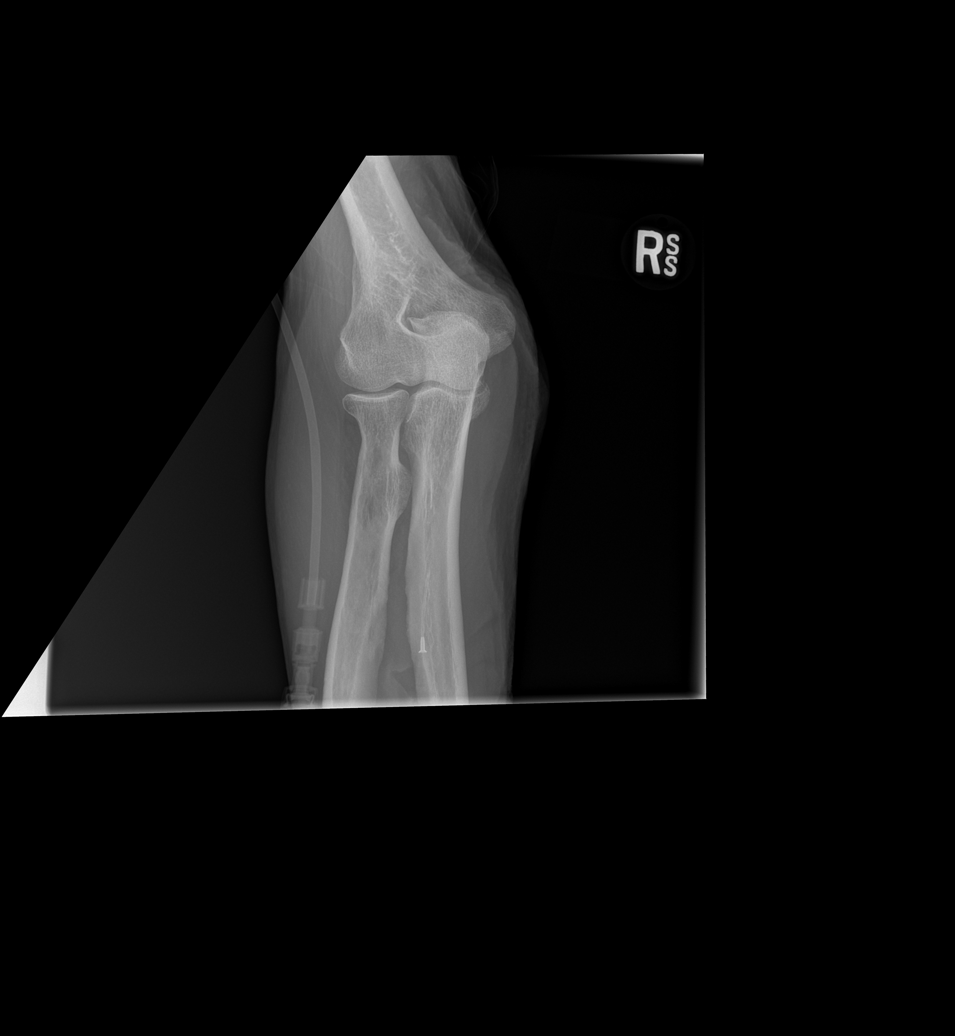

[x elbow obl right (2 of 2)]
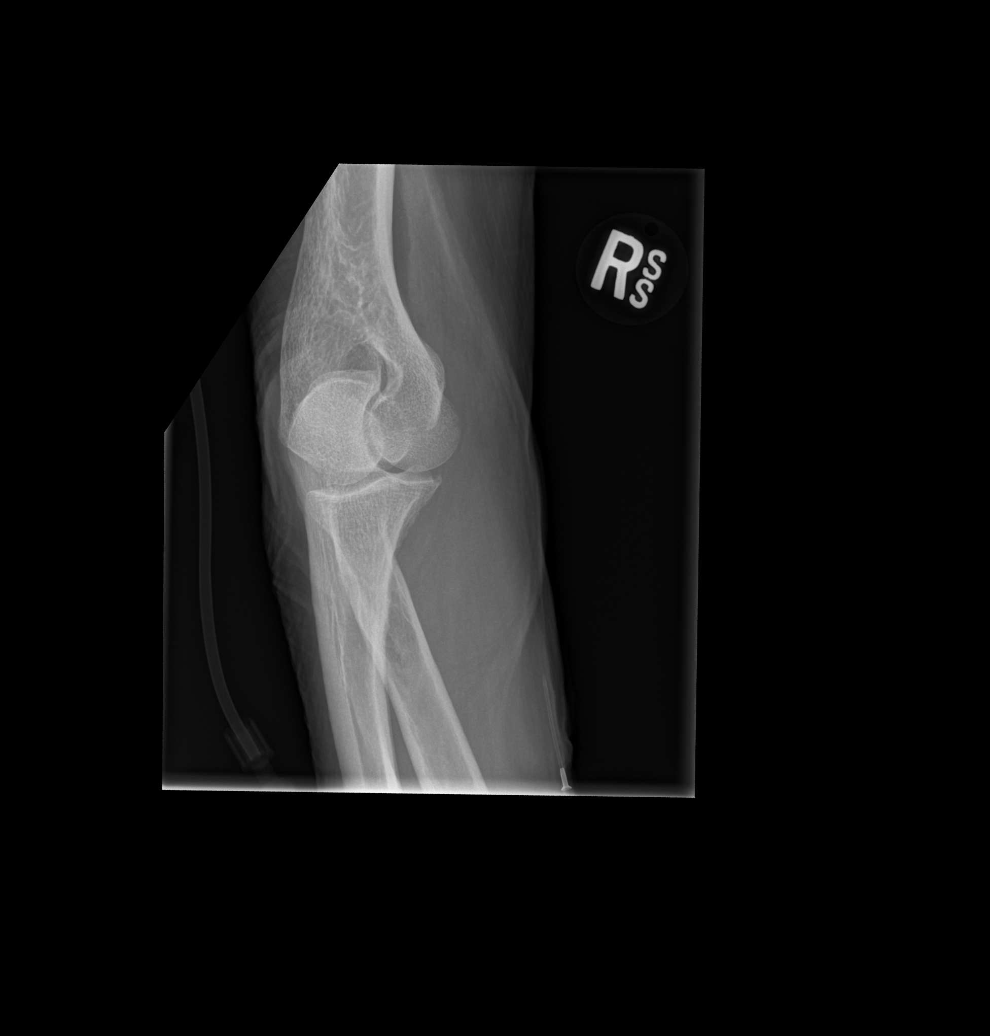

[x elbow lat right]
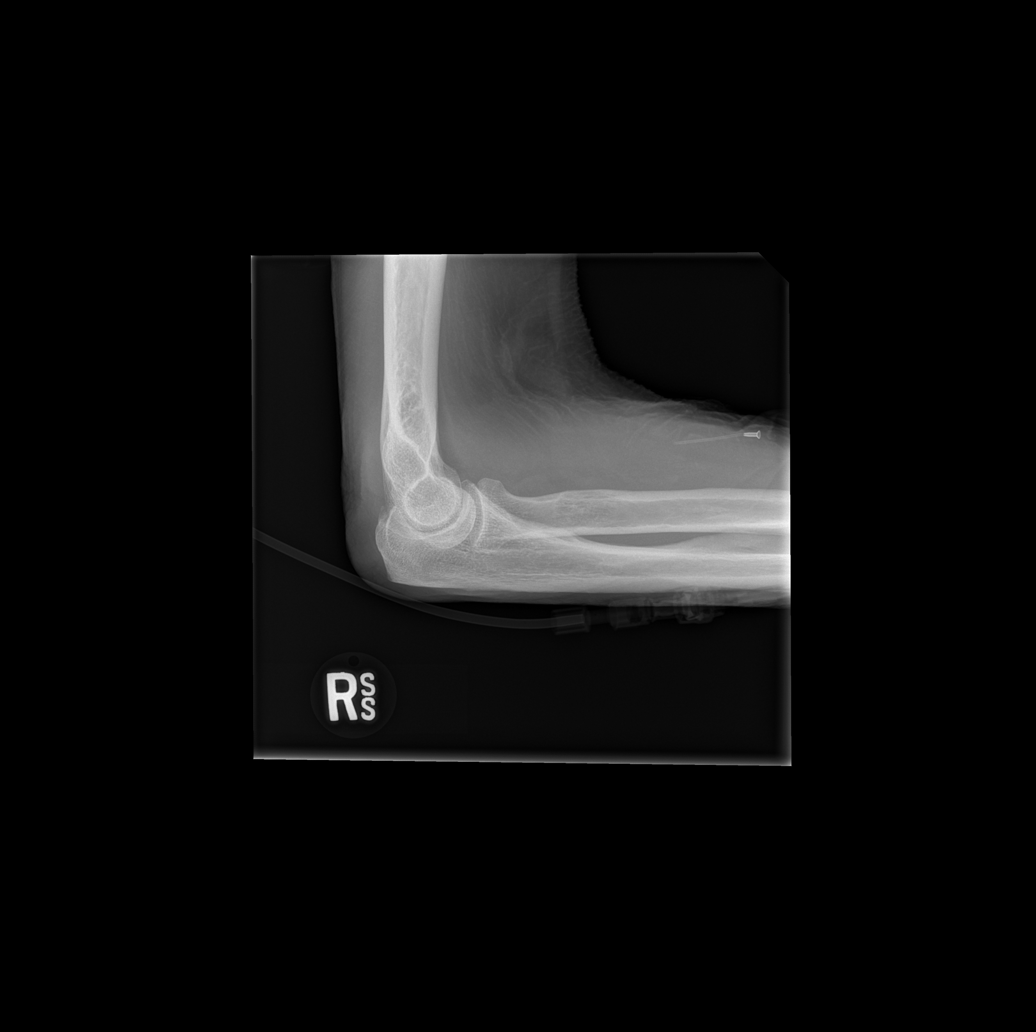

[4 of 4 positions shown; findings below may reference images not displayed]

FINDINGS: No fracture or dislocation is identified. A small elbow joint
effusion is questioned. No destructive lesion or osseous erosion.
Soft tissue thickening posterior to the olecranon. IV cannula in
place in the forearm.
IMPRESSION: Possible small elbow joint effusion. No acute osseous abnormality
identified.
